# Patient Record
Sex: Female | Born: 1995 | Hispanic: No | Marital: Married | Smoking: Never smoker
Health system: Southern US, Community
[De-identification: ages and names within clinical notes are randomized; demographics above are authoritative.]

## PROBLEM LIST (undated history)

## (undated) DIAGNOSIS — D219 Benign neoplasm of connective and other soft tissue, unspecified: Secondary | ICD-10-CM

## (undated) HISTORY — DX: Benign neoplasm of connective and other soft tissue, unspecified: D21.9

## (undated) HISTORY — PX: NO PAST SURGERIES: SHX2092

---

## 2019-06-27 ENCOUNTER — Encounter (HOSPITAL_COMMUNITY): Payer: Self-pay

## 2019-06-27 ENCOUNTER — Inpatient Hospital Stay (HOSPITAL_COMMUNITY)
Admission: AD | Admit: 2019-06-27 | Discharge: 2019-06-27 | Disposition: A | Discharge status: Home / Self Care | Payer: Medicaid Other | Attending: Obstetrics and Gynecology | Admitting: Obstetrics and Gynecology

## 2019-06-27 ENCOUNTER — Other Ambulatory Visit: Payer: Self-pay

## 2019-06-27 ENCOUNTER — Inpatient Hospital Stay (HOSPITAL_COMMUNITY): Payer: Medicaid Other

## 2019-06-27 DIAGNOSIS — O3680X0 Pregnancy with inconclusive fetal viability, not applicable or unspecified: Secondary | ICD-10-CM | POA: Diagnosis not present

## 2019-06-27 DIAGNOSIS — R109 Unspecified abdominal pain: Secondary | ICD-10-CM

## 2019-06-27 DIAGNOSIS — D259 Leiomyoma of uterus, unspecified: Secondary | ICD-10-CM | POA: Insufficient documentation

## 2019-06-27 DIAGNOSIS — Z3A11 11 weeks gestation of pregnancy: Secondary | ICD-10-CM | POA: Insufficient documentation

## 2019-06-27 DIAGNOSIS — O9989 Other specified diseases and conditions complicating pregnancy, childbirth and the puerperium: Secondary | ICD-10-CM | POA: Diagnosis not present

## 2019-06-27 DIAGNOSIS — O3411 Maternal care for benign tumor of corpus uteri, first trimester: Secondary | ICD-10-CM | POA: Diagnosis not present

## 2019-06-27 DIAGNOSIS — R102 Pelvic and perineal pain: Secondary | ICD-10-CM | POA: Diagnosis not present

## 2019-06-27 DIAGNOSIS — Z603 Acculturation difficulty: Secondary | ICD-10-CM

## 2019-06-27 DIAGNOSIS — O26899 Other specified pregnancy related conditions, unspecified trimester: Secondary | ICD-10-CM | POA: Diagnosis present

## 2019-06-27 LAB — ABO/RH: ABO/RH(D): O POS

## 2019-06-27 LAB — WET PREP, GENITAL
Sperm: NONE SEEN
Trich, Wet Prep: NONE SEEN
Yeast Wet Prep HPF POC: NONE SEEN

## 2019-06-27 LAB — URINALYSIS, ROUTINE W REFLEX MICROSCOPIC
Bacteria, UA: NONE SEEN
Bilirubin Urine: NEGATIVE
Glucose, UA: NEGATIVE mg/dL
Ketones, ur: NEGATIVE mg/dL
Leukocytes,Ua: NEGATIVE
Nitrite: NEGATIVE
Protein, ur: NEGATIVE mg/dL
Specific Gravity, Urine: 1.006 (ref 1.005–1.030)
pH: 7 (ref 5.0–8.0)

## 2019-06-27 LAB — CBC WITH DIFFERENTIAL/PLATELET
Abs Immature Granulocytes: 0.03 10*3/uL (ref 0.00–0.07)
Basophils Absolute: 0 10*3/uL (ref 0.0–0.1)
Basophils Relative: 0 %
Eosinophils Absolute: 0.1 10*3/uL (ref 0.0–0.5)
Eosinophils Relative: 2 %
HCT: 38.7 % (ref 36.0–46.0)
Hemoglobin: 12.9 g/dL (ref 12.0–15.0)
Immature Granulocytes: 0 %
Lymphocytes Relative: 28 %
Lymphs Abs: 2.5 10*3/uL (ref 0.7–4.0)
MCH: 25.5 pg — ABNORMAL LOW (ref 26.0–34.0)
MCHC: 33.3 g/dL (ref 30.0–36.0)
MCV: 76.6 fL — ABNORMAL LOW (ref 80.0–100.0)
Monocytes Absolute: 0.8 10*3/uL (ref 0.1–1.0)
Monocytes Relative: 9 %
Neutro Abs: 5.4 10*3/uL (ref 1.7–7.7)
Neutrophils Relative %: 61 %
Platelets: 130 10*3/uL — ABNORMAL LOW (ref 150–400)
RBC: 5.05 MIL/uL (ref 3.87–5.11)
RDW: 14 % (ref 11.5–15.5)
WBC: 8.9 10*3/uL (ref 4.0–10.5)
nRBC: 0 % (ref 0.0–0.2)

## 2019-06-27 LAB — HCG, QUANTITATIVE, PREGNANCY: hCG, Beta Chain, Quant, S: 108655 m[IU]/mL — ABNORMAL HIGH (ref <5)

## 2019-06-27 LAB — HIV ANTIBODY (ROUTINE TESTING W REFLEX): HIV Screen 4th Generation wRfx: NONREACTIVE

## 2019-06-27 MED ORDER — CALCIUM CARBONATE ANTACID 500 MG PO CHEW: 1.0000 | CHEWABLE_TABLET | Freq: Three times a day (TID) | ORAL | 1 refills | Status: DC | PRN

## 2019-06-27 MED ORDER — ALUM & MAG HYDROXIDE-SIMETH 200-200-20 MG/5ML PO SUSP
30.0000 mL | Freq: Once | ORAL | Status: AC
  Administered 2019-06-27: 30 mL via ORAL
  Filled 2019-06-27: qty 30

## 2019-06-27 MED ORDER — HYOSCYAMINE SULFATE 0.125 MG SL SUBL
0.2500 mg | SUBLINGUAL_TABLET | Freq: Once | SUBLINGUAL | Status: AC
  Administered 2019-06-27: 0.25 mg via SUBLINGUAL
  Filled 2019-06-27: qty 2

## 2019-06-27 NOTE — MAU Provider Note (Signed)
Chief Complaint: Abdominal Pain   First Provider Initiated Contact with Patient 06/27/19 0214        SUBJECTIVE HPI: Betty Crawford is a 23 y.o. G1P0 at Unknown by LMP who presents to maternity admissions reporting upper abdominal discomfort and burping which woke her up.  Also has some lower abdominal cramping.  Did not take anything for it. Has not had any prenatal care yet except a pregnancy test at Urgent Care.. She denies vaginal bleeding, vaginal itching/burning, urinary symptoms, h/a, dizziness, n/v, or fever/chills.    New to [place] from [place].  Cannot get interpretor on Ipad.  Husband translates (he has been in [place] 7 yrs)  RN note: Pt reporting increase in gas/burping x3-4 weeks and reports unable to sleep. Abdominal pain at night   Level 5 Caveat due to language barrier and no translator except husband  Social History   Socioeconomic History  . Marital status: Married    Spouse name: Not on file  . Number of children: Not on file  . Years of education: Not on file  . Highest education level: Not on file  Occupational History  . Not on file  Social Needs  . Financial resource strain: Not on file  . Food insecurity    Worry: Not on file    Inability: Not on file  . Transportation needs    Medical: Not on file    Non-medical: Not on file  Tobacco Use  . Smoking status: Not on file  Substance and Sexual Activity  . Alcohol use: Not on file  . Drug use: Not on file  . Sexual activity: Not on file  Lifestyle  . Physical activity    Days per week: Not on file    Minutes per session: Not on file  . Stress: Not on file  Relationships  . Social Herbalist on phone: Not on file    Gets together: Not on file    Attends religious service: Not on file    Active member of club or organization: Not on file    Attends meetings of clubs or organizations: Not on file    Relationship status: Not on file  . Intimate partner violence    Fear of current or ex partner:  Not on file    Emotionally abused: Not on file    Physically abused: Not on file    Forced sexual activity: Not on file  Other Topics Concern  . Not on file  Social History Narrative  . Not on file   No current facility-administered medications on file prior to encounter.    No current outpatient medications on file prior to encounter.   No Known Allergies  I have reviewed patient's Past Medical Hx, Surgical Hx, Family Hx, Social Hx, medications and allergies.   ROS:  Review of Systems  Constitutional: Negative for chills, fatigue and fever.  HENT: Negative for sore throat.   Respiratory: Negative for shortness of breath.   Gastrointestinal: Positive for abdominal pain. Negative for constipation, diarrhea, nausea and vomiting.  Genitourinary: Positive for pelvic pain. Negative for dysuria and vaginal bleeding.   Review of Systems  Other systems negative   Physical Exam  Physical Exam Vitals:   06/27/19 0221 06/27/19 0459  BP: (!) 129/56 125/64  Pulse: 64 (!) 56  Resp: 16 17  Temp: (!) 97.4 F (36.3 C) 98.9 F (37.2 C)  TempSrc: Oral Oral    Constitutional: Well-developed, well-nourished female in no acute distress.  Cardiovascular: normal rate Respiratory: normal effort GI: Abd soft, diffusely tender throughout. Pos BS x 4 MS: Extremities nontender, no edema, normal ROM Neurologic: Alert and oriented x 4.  GU: Neg CVAT.  PELVIC EXAM: Cervix pink, visually closed, without lesion, scant white creamy discharge, vaginal walls and external genitalia normal Bimanual exam: Cervix 0/long/high, firm, anterior, neg CMT, uterus mildly tender, 12 wk size enlarged, adnexa without tenderness, enlargement, or mass    LAB RESULTS Results for orders placed or performed during the hospital encounter of 06/27/19 (from the past 24 hour(s))  Urinalysis, Routine w reflex microscopic     Status: Abnormal   Collection Time: 06/27/19  1:58 AM  Result Value Ref Range   Color, Urine  STRAW (A) YELLOW   APPearance CLEAR CLEAR   Specific Gravity, Urine 1.006 1.005 - 1.030   pH 7.0 5.0 - 8.0   Glucose, UA NEGATIVE NEGATIVE mg/dL   Hgb urine dipstick SMALL (A) NEGATIVE   Bilirubin Urine NEGATIVE NEGATIVE   Ketones, ur NEGATIVE NEGATIVE mg/dL   Protein, ur NEGATIVE NEGATIVE mg/dL   Nitrite NEGATIVE NEGATIVE   Leukocytes,Ua NEGATIVE NEGATIVE   RBC / HPF 0-5 0 - 5 RBC/hpf   WBC, UA 0-5 0 - 5 WBC/hpf   Bacteria, UA NONE SEEN NONE SEEN   Squamous Epithelial / LPF 0-5 0 - 5  ABO/Rh     Status: None   Collection Time: 06/27/19  2:47 AM  Result Value Ref Range   ABO/RH(D)      O POS Performed at [facility], [address], [place], [place] [address]   hCG, quantitative, pregnancy     Status: Abnormal   Collection Time: 06/27/19  2:47 AM  Result Value Ref Range   hCG, Beta [employer], S 108,655 (H) <5 mIU/mL  CBC with Differential/Platelet     Status: Abnormal   Collection Time: 06/27/19  2:47 AM  Result Value Ref Range   WBC 8.9 4.0 - 10.5 K/uL   RBC 5.05 3.87 - 5.11 MIL/uL   Hemoglobin 12.9 12.0 - 15.0 g/dL   HCT 38.7 36.0 - 46.0 %   MCV 76.6 (L) 80.0 - 100.0 fL   MCH 25.5 (L) 26.0 - 34.0 pg   MCHC 33.3 30.0 - 36.0 g/dL   RDW 14.0 11.5 - 15.5 %   Platelets 130 (L) 150 - 400 K/uL   nRBC 0.0 0.0 - 0.2 %   Neutrophils Relative % 61 %   Neutro Abs 5.4 1.7 - 7.7 K/uL   Lymphocytes Relative 28 %   Lymphs Abs 2.5 0.7 - 4.0 K/uL   Monocytes Relative 9 %   Monocytes Absolute 0.8 0.1 - 1.0 K/uL   Eosinophils Relative 2 %   Eosinophils Absolute 0.1 0.0 - 0.5 K/uL   Basophils Relative 0 %   Basophils Absolute 0.0 0.0 - 0.1 K/uL   Immature Granulocytes 0 %   Abs Immature Granulocytes 0.03 0.00 - 0.07 K/uL  Wet prep, genital     Status: Abnormal   Collection Time: 06/27/19  2:50 AM   Specimen: Cervical/Vaginal swab  Result Value Ref Range   Yeast Wet Prep HPF POC NONE SEEN NONE SEEN   Trich, Wet Prep NONE SEEN NONE SEEN   Clue Cells Wet Prep HPF POC  PRESENT (A) NONE SEEN   WBC, Wet Prep HPF POC FEW (A) NONE SEEN   Sperm NONE SEEN      IMAGING US Ob Comp Less 14 Wks  Result Date:  06/27/2019 CLINICAL DATA:  Pelvic pain EXAM: OBSTETRIC <14 WK ULTRASOUND TECHNIQUE: Transabdominal ultrasound was performed for evaluation of the gestation as well as the maternal uterus and adnexal regions. COMPARISON:  None. FINDINGS: LMP: 04/07/2019 Gestational age by LMP: 11 weeks 4 days EDC by LMP: 01/12/2020 Intrauterine gestational sac: Single Yolk sac:  Visualized. Embryo:  Visualized. Cardiac Activity: Visualized. Heart Rate: 166 bpm CRL:   46.7 mm   11 w 3 d                  [place] EDC: 01/13/2020 Subchorionic hemorrhage: Small amount of subchorionic hemorrhage seen posteriorly Maternal uterus/adnexae: Normal appearance of the ovaries with a left corpus luteum cyst. A 5.0 x 3.7 x 3.5 cm hypoechoic fibroid is seen in the lower uterine segment. IMPRESSION: Single viable intrauterine gestation. Estimated gestational age of [redacted] weeks 3 days by crown-rump length. Left corpus luteum cyst. 5 cm uterine fibroid in the lower uterine segment. Electronically Signed   By: Lovena Le M.D.   On: 06/27/2019 03:36   MAU Management/MDM: Ordered usual first trimester r/o ectopic labs.   Pelvic exam and cultures done Will check baseline Ultrasound to rule out ectopic.  This bleeding/pain can represent a normal pregnancy with bleeding, spontaneous abortion or even an ectopic which can be life-threatening.  The process as listed above helps to determine which of these is present.  Ordered Levsin and Maalox for abdominal discomfort.  States it helped some but is still feeling crampy throughout DIscussed that early pregnancy can cause nausea and constipation.  This may be related to her discomfort, though it is not clear what the source is.  Labs revewed and are WNL.  No laukocytosis.  [place] rules out ectopic pregnancy.  ASSESSMENT Single intrauterine pregnancy at [redacted]w[redacted]d [place] confirms  LMP dates Colicky abdominal cramping  PLAN Discharge home Rx Tums for prn use at home Has prenatal vitamins at home List of OB providers given Proof of pregnancy letter given to apply for Medicaid Pt stable at time of discharge. Encouraged to return here or to other Urgent Care/ED if she develops worsening of symptoms, increase in pain, fever, or other concerning symptoms.    Hansel Feinstein CNM, MSN Certified Nurse-Midwife 06/27/2019  2:15 AM

## 2019-06-27 NOTE — MAU Note (Signed)
Pt reporting increase in gas/burping x3-4 weeks and reports unable to sleep. Abdominal pain at night.

## 2019-06-27 NOTE — Discharge Instructions (Signed)
Heartburn During Pregnancy  Heartburn is pain or discomfort in the throat or chest. It may cause a burning feeling. It happens when stomach acid moves up into the tube that carries food from your mouth to your stomach (esophagus). Heartburn is common during pregnancy. It usually goes away or gets better after giving birth. Follow these instructions at home: Eating and drinking  Do not drink alcohol while you are pregnant.  Figure out which foods and beverages make you feel worse, and avoid them.  Beverages that you may want to avoid include: ? Coffee and tea (with or without caffeine). ? Energy drinks and sports drinks. ? Bubbly (carbonated) drinks or sodas. ? Citrus fruit juices.  Foods that you may want to avoid include: ? Chocolate and cocoa. ? Peppermint and mint flavorings. ? Garlic, onions, and horseradish. ? Spicy and acidic foods. These include peppers, chili powder, curry powder, vinegar, hot sauces, and barbecue sauce. ? Citrus fruits, such as oranges, lemons, and limes. ? Tomato-based foods, such as red sauce, chili, and salsa. ? Fried and fatty foods, such as donuts, french fries, potato chips, and high-fat dressings. ? High-fat meats, such as hot dogs, cold cuts, sausage, ham, and bacon. ? High-fat dairy items, such as whole milk, butter, and cheese.  Eat small meals often, instead of large meals.  Avoid drinking a lot of liquid with your meals.  Avoid eating meals during the 2-3 hours before you go to bed.  Avoid lying down right after you eat.  Do not exercise right after you eat. Medicines  Take over-the-counter and prescription medicines only as told by your doctor.  Do not take aspirin, ibuprofen, or other NSAIDs unless your doctor tells you to do that.  Your doctor may tell you to avoid medicines that have sodium bicarbonate in them. General instructions   If told, raise the head of your bed about 6 inches (15 cm). You can do this by putting blocks  under the legs. Sleeping with more pillows does not help with heartburn.  Do not use any products that contain nicotine or tobacco, such as cigarettes and e-cigarettes. If you need help quitting, ask your doctor.  Wear loose-fitting clothing.  Try to lower your stress, such as with yoga or meditation. If you need help, ask your doctor.  Stay at a healthy weight. If you are overweight, work with your doctor to safely lose weight.  Keep all follow-up visits as told by your doctor. This is important. Contact a doctor if:  You get new symptoms.  Your symptoms do not get better with treatment.  You have weight loss and you do not know why.  You have trouble swallowing.  You make loud sounds when you breathe (wheeze).  You have a cough that does not go away.  You have heartburn often for more than 2 weeks.  You feel sick to your stomach (nauseous), and this does not get better with treatment.  You are throwing up (vomiting), and this does not get better with treatment.  You have pain in your belly (abdomen). Get help right away if:  You have very bad chest pain that spreads to your arm, neck, or jaw.  You feel sweaty, dizzy, or light-headed.  You have trouble breathing.  You have pain when swallowing.  You throw up and your throw-up looks like blood or coffee grounds.  Your poop (stool) is bloody or black. This information is not intended to replace advice given to you by your health  care provider. Make sure you discuss any questions you have with your health care provider. Document Released: 11/25/2010 Document Revised: 02/13/2019 Document Reviewed: 07/10/2016 Elsevier Patient Education  2020 [place] for [employer] at [facility]       Phone: [phone]  [place] for [employer] at [place]   Phone: [place] for [employer] at [place]  Phone: [place] for  [place] at [employer]  Phone: [place] for [employer] at [place]  Phone: [place] for [place] at [facility]   Phone: [facility]       Phone: [phone]  [facility]    Phone: [phone]   [facility]    Phone: [phone]  [facility]    Phone: [place]    Phone: [phone]  [place] [facility]       Phone: [phone]   [place] [facility]  Phone: [place]    Phone: [phone]  [facility]   Phone: [phone] Second Trimester of Pregnancy  The second trimester is from week 14 through week 27 (month 4 through 6). This is often the time in pregnancy that you feel your best. Often times, morning sickness has lessened or quit. You may have more energy, and you may get hungry more often. Your unborn baby is growing rapidly. At the end of the sixth month, he or she is about 9 inches long and weighs about 1 pounds. You will likely feel the baby move between 18 and 20 weeks of pregnancy. Follow these instructions at home: Medicines  Take over-the-counter and prescription medicines only as told by your doctor. Some medicines are safe and some medicines are not safe during pregnancy.  Take a prenatal vitamin that contains at least 600 micrograms (mcg) of folic acid.  If you have trouble pooping (constipation), take medicine that will make your stool soft (stool softener) if your doctor approves. Eating and drinking   Eat regular, healthy meals.  Avoid raw meat and uncooked cheese.  If you get low calcium from the food you eat, talk to your doctor about taking a daily calcium supplement.  Avoid foods that are high in fat and sugars, such as fried and sweet foods.  If  you feel sick to your stomach (nauseous) or throw up (vomit): ? Eat 4 or 5 small meals a day instead of 3 large meals. ? Try eating a few soda crackers. ? Drink liquids between meals instead of during meals.  To prevent constipation: ? Eat foods that are high in fiber, like fresh fruits and vegetables, whole grains, and beans. ? Drink enough fluids to keep your pee (urine) clear or pale yellow. Activity  Exercise only as told by your doctor. Stop exercising if you start to have cramps.  Do not exercise if it is too hot, too humid, or if you are in a place of great height (high altitude).  Avoid heavy lifting.  Wear low-heeled shoes. Sit and stand up straight.  You can continue to have sex unless your doctor tells you not to. Relieving pain and discomfort  Wear a good support bra if your breasts are tender.  Take warm water baths (sitz baths) to soothe pain or discomfort caused by hemorrhoids. Use hemorrhoid cream if your doctor approves.  Rest with your legs raised if you have leg cramps or low back pain.  If you develop puffy, bulging veins (varicose veins) in your legs: ? Wear support hose or compression stockings as told by your doctor. ? Raise (elevate) your feet for 15 minutes, 3-4 times a day. ? Limit salt in your food. Prenatal care  Write down your questions. Take them to your prenatal visits.  Keep all your prenatal visits as told by your doctor. This is important. Safety  Wear your seat belt when driving.  Make a list of emergency phone numbers, including numbers for family, friends, the hospital, and police and fire departments. General instructions  Ask your doctor about the right foods to eat or for help finding a counselor, if you need these services.  Ask your doctor about local prenatal classes. Begin classes before month 6 of your pregnancy.  Do not use hot tubs, steam rooms, or saunas.  Do not douche or use tampons or scented sanitary pads.  Do not  cross your legs for long periods of time.  Visit your dentist if you have not done so. Use a soft toothbrush to brush your teeth. Floss gently.  Avoid all smoking, herbs, and alcohol. Avoid drugs that are not approved by your doctor.  Do not use any products that contain nicotine or tobacco, such as cigarettes and e-cigarettes. If you need help quitting, ask your doctor.  Avoid cat litter boxes and soil used by cats. These carry germs that can cause birth defects in the baby and can cause a loss of your baby (miscarriage) or stillbirth. Contact a doctor if:  You have mild cramps or pressure in your lower belly.  You have pain when you pee (urinate).  You have bad smelling fluid coming from your vagina.  You continue to feel sick to your stomach (nauseous), throw up (vomit), or have watery poop (diarrhea).  You have a nagging pain in your belly area.  You feel dizzy. Get help right away if:  You have a fever.  You are leaking fluid from your vagina.  You have spotting or bleeding from your vagina.  You have severe belly cramping or pain.  You lose or gain weight rapidly.  You have trouble catching your breath and have chest pain.  You notice sudden or extreme puffiness (swelling) of your face, hands, ankles, feet, or legs.  You have not felt the baby move in over an hour.  You have severe headaches that do not go away when you take medicine.  You have trouble seeing. Summary  The second trimester is from week 14 through week 27 (months 4 through 6). This is often the time in pregnancy that you feel your best.  To take care of yourself and your unborn baby, you will need to eat healthy meals, take medicines only if your doctor tells you to do so, and do activities that are safe for you and your baby.  Call your doctor if you get sick or if you notice anything unusual about your pregnancy. Also, call your doctor if you need help with the right food to eat, or if you want  to know what activities are safe for you. This information is not intended to replace advice given to you by your health care provider. Make sure you discuss any questions you have with your health care provider. Document Released: 01/17/2010 Document Revised: 02/14/2019 Document Reviewed: 11/28/2016 Elsevier Patient Education  2020 [facility]    Phone: [phone]

## 2019-06-28 LAB — GC/CHLAMYDIA PROBE AMP (~~LOC~~) NOT AT ARMC
Chlamydia: NEGATIVE
Neisseria Gonorrhea: NEGATIVE

## 2019-07-21 ENCOUNTER — Encounter: Payer: Self-pay | Admitting: Obstetrics and Gynecology

## 2019-07-21 ENCOUNTER — Ambulatory Visit (INDEPENDENT_AMBULATORY_CARE_PROVIDER_SITE_OTHER): Payer: Medicaid Other | Admitting: Obstetrics and Gynecology

## 2019-07-21 ENCOUNTER — Other Ambulatory Visit: Payer: Self-pay

## 2019-07-21 VITALS — BP 135/84 | HR 88 | Wt 150.0 lb

## 2019-07-21 DIAGNOSIS — Z124 Encounter for screening for malignant neoplasm of cervix: Secondary | ICD-10-CM | POA: Diagnosis not present

## 2019-07-21 DIAGNOSIS — Z113 Encounter for screening for infections with a predominantly sexual mode of transmission: Secondary | ICD-10-CM | POA: Diagnosis not present

## 2019-07-21 DIAGNOSIS — Z789 Other specified health status: Secondary | ICD-10-CM

## 2019-07-21 DIAGNOSIS — R12 Heartburn: Secondary | ICD-10-CM

## 2019-07-21 DIAGNOSIS — Z758 Other problems related to medical facilities and other health care: Secondary | ICD-10-CM

## 2019-07-21 DIAGNOSIS — R109 Unspecified abdominal pain: Secondary | ICD-10-CM

## 2019-07-21 DIAGNOSIS — Z3400 Encounter for supervision of normal first pregnancy, unspecified trimester: Secondary | ICD-10-CM

## 2019-07-21 DIAGNOSIS — Z603 Acculturation difficulty: Secondary | ICD-10-CM

## 2019-07-21 DIAGNOSIS — O26899 Other specified pregnancy related conditions, unspecified trimester: Secondary | ICD-10-CM

## 2019-07-21 DIAGNOSIS — Z23 Encounter for immunization: Secondary | ICD-10-CM

## 2019-07-21 DIAGNOSIS — O26892 Other specified pregnancy related conditions, second trimester: Secondary | ICD-10-CM

## 2019-07-21 HISTORY — DX: Encounter for supervision of normal first pregnancy, unspecified trimester: Z34.00

## 2019-07-21 NOTE — Progress Notes (Signed)
INITIAL PRENATAL VISIT NOTE  Subjective:  Betty Crawford is a 23 y.o. G1P0 at [redacted]w[redacted]d by LMP being seen today for her initial prenatal visit. This is a planned pregnancy. She and partner are happy with the pregnancy. She has a medical history significant for n/a.  Patient reports some vaginal pressure earlier in pregnancy, now improved. .  Contractions: Not present. Vag. Bleeding: None.   . Denies leaking of fluid.    History reviewed. No pertinent past medical history.  History reviewed. No pertinent surgical history.  OB History  Gravida Para Term Preterm AB Living  1            SAB TAB Ectopic Multiple Live Births               # Outcome Date GA Lbr Len/2nd Weight Sex Delivery Anes PTL Lv  1 Current             Social History   Socioeconomic History  . Marital status: Married    Spouse name: Not on file  . Number of children: Not on file  . Years of education: Not on file  . Highest education level: Not on file  Occupational History  . Not on file  Social Needs  . Financial resource strain: Not on file  . Food insecurity    Worry: Not on file    Inability: Not on file  . Transportation needs    Medical: Not on file    Non-medical: Not on file  Tobacco Use  . Smoking status: Never Smoker  . Smokeless tobacco: Never Used  Substance and Sexual Activity  . Alcohol use: Never    Frequency: Never  . Drug use: Never  . Sexual activity: Yes  Lifestyle  . Physical activity    Days per week: Not on file    Minutes per session: Not on file  . Stress: Not on file  Relationships  . Social Herbalist on phone: Not on file    Gets together: Not on file    Attends religious service: Not on file    Active member of club or organization: Not on file    Attends meetings of clubs or organizations: Not on file    Relationship status: Not on file  Other Topics Concern  . Not on file  Social History Narrative  . Not on file    History reviewed. No pertinent  family history.   Current Outpatient Medications:  .  calcium carbonate (TUMS) 500 MG chewable tablet, Chew 1 tablet (200 mg of elemental calcium total) by mouth every 8 (eight) hours as needed for indigestion or heartburn., Disp: 30 tablet, Rfl: 1 .  Prenatal Vit-Fe Fumarate-FA (MULTIVITAMIN-PRENATAL) 27-0.8 MG TABS tablet, Take 1 tablet by mouth daily at 12 noon., Disp: , Rfl:   No Known Allergies  Review of Systems: Negative except for what is mentioned in HPI.  Objective:   Vitals:   07/21/19 1343  BP: 135/84  Pulse: 88  Weight: 150 lb (68 kg)   Fetal Status: Fetal Heart Rate (bpm): 143         Physical Exam: BP 135/84   Pulse 88   Wt 150 lb (68 kg)   LMP 04/07/2019  CONSTITUTIONAL: Well-developed, well-nourished female in no acute distress.  NEUROLOGIC: Alert and oriented to person, place, and time. Normal reflexes, muscle tone coordination. No cranial nerve deficit noted. PSYCHIATRIC: Normal mood and affect. Normal behavior. Normal judgment and thought content. SKIN:  Skin is warm and dry. No rash noted. Not diaphoretic. No erythema. No pallor. HENT:  Normocephalic, atraumatic, External right and left ear normal. Oropharynx is clear and moist EYES: Conjunctivae and EOM are normal. Pupils are equal, round, and reactive to light. No scleral icterus.  NECK: Normal range of motion, supple, no masses CARDIOVASCULAR: Normal heart rate noted RESPIRATORY: Effort normal, no problems with respiration noted BREASTS: symmetric, non-tender, no masses palpable ABDOMEN: Soft, nontender, nondistended, gravid. GU: normal appearing external female genitalia, nulliparous normal appearing cervix, scant white discharge in vagina, no lesions noted, very friable and some light spotting with pap Bimanual: 15 weeks sized uterus, no adnexal tenderness or palpable lesions noted MUSCULOSKELETAL: Normal range of motion. EXT:  No edema and no tenderness. 2+ distal pulses.  Assessment and Plan:   Pregnancy: G1P0 at [redacted]w[redacted]d by LMP c/w 1st trim [place]  1. Abdominal pain affecting pregnancy Vaginal pressure earlier in pregnancy  2. Supervision of normal first pregnancy, antepartum - Culture, OB Urine - Obstetric Panel, Including HIV - Genetic Screening - Cytology - PAP( [place]) - Cervicovaginal ancillary only( [place]) - Babyscripts Schedule Optimization [place] for [employer] structure, multiple providers, fellows, medical students, virtual visits, MyChart.   3. Language barrier Arabic interpretor used  4. Heartburn Taking tums Reviewed can sent prescription if worsens   Preterm labor symptoms and general obstetric precautions including but not limited to vaginal bleeding, contractions, leaking of fluid and fetal movement were reviewed in detail with the patient.  Please refer to After Visit Summary for other counseling recommendations.   Return in about 4 weeks (around 08/18/2019) for low OB, virtual.  Sloan Leiter 07/21/2019 2:28 PM

## 2019-07-21 NOTE — Progress Notes (Signed)
Patient is in the office for NOB visit, reports pelvic pressure and acid reflux. Pt states she has not started feeling fetal movement yet.

## 2019-07-21 NOTE — Patient Instructions (Signed)
For colds and allergies  Any anti-histamine including benadryl, allegra, claritin, etc.  Sudafed but not phenylephrine  Mucinex  Robitussin  For Reflux/heartburn  Pepcid Zantac Tums Prilosec Prevacid  For yeast infections  Monistat  For constipation  Colace  For minor aches and pains  Tylenol-do not take more than 4000mg in 24 hours. Therma-care or like heat packs  

## 2019-07-22 LAB — OBSTETRIC PANEL, INCLUDING HIV
Antibody Screen: NEGATIVE
Basophils Absolute: 0 10*3/uL (ref 0.0–0.2)
Basos: 0 %
EOS (ABSOLUTE): 0.1 10*3/uL (ref 0.0–0.4)
Eos: 1 %
HIV Screen 4th Generation wRfx: NONREACTIVE
Hematocrit: 39.5 % (ref 34.0–46.6)
Hemoglobin: 13.2 g/dL (ref 11.1–15.9)
Hepatitis B Surface Ag: NEGATIVE
Immature Grans (Abs): 0 10*3/uL (ref 0.0–0.1)
Immature Granulocytes: 0 %
Lymphocytes Absolute: 1.8 10*3/uL (ref 0.7–3.1)
Lymphs: 17 %
MCH: 25.4 pg — ABNORMAL LOW (ref 26.6–33.0)
MCHC: 33.4 g/dL (ref 31.5–35.7)
MCV: 76 fL — ABNORMAL LOW (ref 79–97)
Monocytes Absolute: 0.8 10*3/uL (ref 0.1–0.9)
Monocytes: 8 %
Neutrophils Absolute: 7.7 10*3/uL — ABNORMAL HIGH (ref 1.4–7.0)
Neutrophils: 74 %
Platelets: 185 10*3/uL (ref 150–450)
RBC: 5.19 x10E6/uL (ref 3.77–5.28)
RDW: 14.5 % (ref 11.7–15.4)
RPR Ser Ql: NONREACTIVE
Rh Factor: POSITIVE
Rubella Antibodies, IGG: 29 index (ref >0.99)
WBC: 10.5 10*3/uL (ref 3.4–10.8)

## 2019-07-22 MED ORDER — BLOOD PRESSURE KIT DEVI: 1.0000 | 0 refills | Status: DC | PRN

## 2019-07-22 NOTE — Addendum Note (Signed)
Addended by: Tristan Schroeder D on: 07/22/2019 08:46 AM   Modules accepted: Orders

## 2019-07-23 LAB — CYTOLOGY - PAP
Chlamydia: NEGATIVE
Diagnosis: NEGATIVE
Neisseria Gonorrhea: NEGATIVE

## 2019-07-23 LAB — URINE CULTURE, OB REFLEX

## 2019-07-23 LAB — CULTURE, OB URINE

## 2019-07-30 ENCOUNTER — Encounter: Payer: Self-pay | Admitting: Obstetrics and Gynecology

## 2019-08-01 ENCOUNTER — Encounter: Payer: Self-pay | Admitting: Obstetrics and Gynecology

## 2019-08-18 ENCOUNTER — Ambulatory Visit (INDEPENDENT_AMBULATORY_CARE_PROVIDER_SITE_OTHER): Payer: Medicaid Other | Admitting: Obstetrics

## 2019-08-18 ENCOUNTER — Encounter: Payer: Self-pay | Admitting: Obstetrics

## 2019-08-18 ENCOUNTER — Other Ambulatory Visit: Payer: Self-pay

## 2019-08-18 DIAGNOSIS — Z3A19 19 weeks gestation of pregnancy: Secondary | ICD-10-CM

## 2019-08-18 DIAGNOSIS — Z3402 Encounter for supervision of normal first pregnancy, second trimester: Secondary | ICD-10-CM

## 2019-08-18 DIAGNOSIS — Z3400 Encounter for supervision of normal first pregnancy, unspecified trimester: Secondary | ICD-10-CM

## 2019-08-18 NOTE — Progress Notes (Signed)
S/w pt and husband for webex visit. Pt reports fetal movement, denies pain. Pt and husband state that they have not been able to get BP cuff to work, advised them to bring it into the office.

## 2019-08-18 NOTE — Progress Notes (Signed)
   [place] VIRTUAL VIDEO VISIT ENCOUNTER NOTE  Provider location: [facility] at [place]   I connected with Lourena Simmonds on 08/18/19 at  9:30 AM EDT by WebEx OB Video Encounter at home and verified that I am speaking with the correct person using two identifiers.   I discussed the limitations, risks, security and privacy concerns of performing an evaluation and management service virtually and the availability of in person appointments. I also discussed with the patient that there may be a patient responsible charge related to this service. The patient expressed understanding and agreed to proceed. Subjective:  Betty Crawford is a 23 y.o. G1P0 at [redacted]w[redacted]d being seen today for ongoing prenatal care.  She is currently monitored for the following issues for this low-risk pregnancy and has Abdominal pain affecting pregnancy; Language barrier, cultural differences; and Supervision of normal first pregnancy, antepartum on their problem list.  Patient reports no complaints.  Contractions: Not present. Vag. Bleeding: None.  Movement: Present. Denies any leaking of fluid.   The following portions of the patient's history were reviewed and updated as appropriate: allergies, current medications, past family history, past medical history, past social history, past surgical history and problem list.   Objective:  There were no vitals filed for this visit.  Fetal Status:     Movement: Present     General:  Alert, oriented and cooperative. Patient is in no acute distress.  Respiratory: Normal respiratory effort, no problems with respiration noted  Mental Status: Normal mood and affect. Normal behavior. Normal judgment and thought content.  Rest of physical exam deferred due to type of encounter  Imaging: No results found.  Assessment and Plan:  Pregnancy: G1P0 at [redacted]w[redacted]d 1. Supervision of normal first pregnancy, antepartum   Preterm labor symptoms and general  obstetric precautions including but not limited to vaginal bleeding, contractions, leaking of fluid and fetal movement were reviewed in detail with the patient. I discussed the assessment and treatment plan with the patient. The patient was provided an opportunity to ask questions and all were answered. The patient agreed with the plan and demonstrated an understanding of the instructions. The patient was advised to call back or seek an in-person office evaluation/go to MAU at [facility] for any urgent or concerning symptoms. Please refer to After Visit Summary for other counseling recommendations.   I provided 10 minutes of face-to-face time during this encounter.  No follow-ups on file.  Future Appointments  Date Time Provider [place]  08/20/2019 10:30 AM WH-MFC [place] 1 WH-MFCUS MFC-US     , MD [facility] 08/18/2019

## 2019-08-20 ENCOUNTER — Ambulatory Visit (HOSPITAL_COMMUNITY)
Admission: RE | Admit: 2019-08-20 | Discharge: 2019-08-20 | Disposition: A | Discharge status: Home / Self Care | Payer: Medicaid Other | Source: Ambulatory Visit | Attending: Obstetrics and Gynecology | Admitting: Obstetrics and Gynecology

## 2019-08-20 ENCOUNTER — Other Ambulatory Visit: Payer: Self-pay | Admitting: Obstetrics and Gynecology

## 2019-08-20 ENCOUNTER — Other Ambulatory Visit (HOSPITAL_COMMUNITY): Payer: Self-pay | Admitting: *Deleted

## 2019-08-20 ENCOUNTER — Other Ambulatory Visit: Payer: Self-pay

## 2019-08-20 DIAGNOSIS — Z3A19 19 weeks gestation of pregnancy: Secondary | ICD-10-CM

## 2019-08-20 DIAGNOSIS — D259 Leiomyoma of uterus, unspecified: Secondary | ICD-10-CM

## 2019-08-20 DIAGNOSIS — O3412 Maternal care for benign tumor of corpus uteri, second trimester: Secondary | ICD-10-CM

## 2019-08-20 DIAGNOSIS — Z3400 Encounter for supervision of normal first pregnancy, unspecified trimester: Secondary | ICD-10-CM | POA: Diagnosis present

## 2019-08-20 DIAGNOSIS — Z363 Encounter for antenatal screening for malformations: Secondary | ICD-10-CM

## 2019-08-20 DIAGNOSIS — Z362 Encounter for other antenatal screening follow-up: Secondary | ICD-10-CM

## 2019-08-20 DIAGNOSIS — Z3686 Encounter for antenatal screening for cervical length: Secondary | ICD-10-CM | POA: Diagnosis not present

## 2019-09-15 ENCOUNTER — Encounter: Payer: Self-pay | Admitting: Obstetrics and Gynecology

## 2019-09-15 ENCOUNTER — Ambulatory Visit (INDEPENDENT_AMBULATORY_CARE_PROVIDER_SITE_OTHER): Payer: Medicaid Other | Admitting: Obstetrics and Gynecology

## 2019-09-15 ENCOUNTER — Other Ambulatory Visit: Payer: Self-pay

## 2019-09-15 DIAGNOSIS — Z3402 Encounter for supervision of normal first pregnancy, second trimester: Secondary | ICD-10-CM

## 2019-09-15 DIAGNOSIS — Z603 Acculturation difficulty: Secondary | ICD-10-CM

## 2019-09-15 DIAGNOSIS — Z3400 Encounter for supervision of normal first pregnancy, unspecified trimester: Secondary | ICD-10-CM

## 2019-09-15 DIAGNOSIS — Z3A23 23 weeks gestation of pregnancy: Secondary | ICD-10-CM

## 2019-09-15 NOTE — Progress Notes (Signed)
[place] VIRTUAL VIDEO VISIT ENCOUNTER NOTE  Provider location: [facility] at [place]   I connected with Betty Crawford on 09/15/19 at  8:30 AM EST by WebEx Video Encounter at home and verified that I am speaking with the correct person using two identifiers.   I discussed the limitations, risks, security and privacy concerns of performing an evaluation and management service virtually and the availability of in person appointments. I also discussed with the patient that there may be a patient responsible charge related to this service. The patient expressed understanding and agreed to proceed. Subjective:  Betty Crawford is a 23 y.o. G1P0 at [redacted]w[redacted]d being seen today for ongoing prenatal care.  She is currently monitored for the following issues for this low-risk pregnancy and has Abdominal pain affecting pregnancy; Language barrier, cultural differences; and Supervision of normal first pregnancy, antepartum on their problem list.  Patient reports no complaints.  Contractions: Not present. Vag. Bleeding: None.  Movement: Present. Denies any leaking of fluid.   The following portions of the patient's history were reviewed and updated as appropriate: allergies, current medications, past family history, past medical history, past social history, past surgical history and problem list.   Objective:  There were no vitals filed for this visit.  Fetal Status:     Movement: Present     General:  Alert, oriented and cooperative. Patient is in no acute distress.  Respiratory: Normal respiratory effort, no problems with respiration noted  Mental Status: Normal mood and affect. Normal behavior. Normal judgment and thought content.  Rest of physical exam deferred due to type of encounter  Imaging: [place] Mfm Ob Transvaginal  Result Date: 08/20/2019 ----------------------------------------------------------------------  OBSTETRICS REPORT                        (Signed Final 08/20/2019 01:14 pm) ---------------------------------------------------------------------- Patient Info  ID #:       [id number]                          D.O.B.:  May 26, 1996 (23 yrs)  Name:       Betty Crawford                  Visit Date: 08/20/2019 10:25 am ---------------------------------------------------------------------- Performed By  Performed By:     Valda Favia          Ref. Address:     [address]  [place]                                                             [place]  Attending:        Johnell Comings MD         [facility]  Referred By:      [facility] ---------------------------------------------------------------------- Orders   #  Description                          Code         Ordered By   1  [place] MFM OB COMP + 14 WK               76805.01     [place]   2  [place] MFM OB TRANSVAGINAL               [id number]      KELLY DAVIS  ----------------------------------------------------------------------   #  Order #                    Accession #                 Episode #   1  [id number]                  [id number]                  [id number]   2  [id number]                  [id number]                  [id number]  ---------------------------------------------------------------------- Indications   Uterine fibroids affecting pregnancy in        O34.12, D25.9   second trimester, antepartum   Encounter for cervical length                  Z36.86   Encounter for antenatal screening for          Z36.3   malformations(low risk NIPS, 4.9FF)   [redacted] weeks gestation of pregnancy                Z3A.19  ---------------------------------------------------------------------- Fetal Evaluation  Num Of  Fetuses:         1  Fetal Heart Rate(bpm):  144  Cardiac Activity:       Observed  Presentation:           Cephalic  Placenta:               Posterior  P. Cord Insertion:      Not well visualized  Amniotic Fluid  AFI FV:      Within normal limits  Largest Pocket(cm)                              3.6 ---------------------------------------------------------------------- Biometry  BPD:      41.7  mm     G. Age:  18w 4d         23  %    CI:        74.88   %    70 - 86                                                          FL/HC:      18.1   %    16.1 - 18.3  HC:      152.9  mm     G. Age:  18w 2d          7  %    HC/AC:      1.12        1.09 - 1.39  AC:       137   mm     G. Age:  19w 1d         40  %    FL/BPD:     66.2   %  FL:       27.6  mm     G. Age:  18w 3d         16  %    FL/AC:      20.1   %    20 - 24  HUM:      27.4  mm     G. Age:  18w 5d         37  %  CER:      19.2  mm     G. Age:  18w 4d         32  %  NFT:       3.4  mm  CM:          5  mm  Est. FW:     256  gm      0 lb 9 oz     19  % ---------------------------------------------------------------------- OB History  Gravidity:    1         Term:   0        Prem:   0        SAB:   0  TOP:          0       Ectopic:  0        Living: 0 ---------------------------------------------------------------------- Gestational Age  LMP:           19w 2d        Date:  04/07/19                 EDD:   01/12/20  U/S Today:     18w 4d                                        EDD:   01/17/20  Best:          Melvyn Neth 2d  Det. By:  LMP  (04/07/19)          EDD:   01/12/20 ---------------------------------------------------------------------- Anatomy  Cranium:               Appears normal         Aortic Arch:            Not well visualized  Cavum:                 Appears normal         Ductal Arch:            Not well visualized  Ventricles:            Appears normal         Diaphragm:              Appears normal  Choroid Plexus:        Appears  normal         Stomach:                Appears normal, left                                                                        sided  Cerebellum:            Appears normal         Abdomen:                Appears normal  Posterior Fossa:       Appears normal         Abdominal Wall:         Appears nml (cord                                                                        insert, abd wall)  Nuchal Fold:           Appears normal         Cord Vessels:           Appears normal (3                                                                        vessel cord)  Face:                  Appears normal         Kidneys:                Appear normal                         (orbits and profile)  Lips:  Appears normal         Bladder:                Appears normal  Thoracic:              Appears normal         Spine:                  Not well visualized  Heart:                 Not well visualized    Upper Extremities:      Visualized  RVOT:                  Not well visualized    Lower Extremities:      Appears normal  LVOT:                  Not well visualized  Other:  Heels visualized. Nasal bone visualized. Feet visualized. Hands not          well visualized. ---------------------------------------------------------------------- Cervix Uterus Adnexa  Cervix  Length:            3.9  cm.  Normal appearance by transabdominal scan.  Uterus  Single fibroid noted, see table below.  Left Ovary  No adnexal mass visualized.  Right Ovary  No adnexal mass visualized.  Cul De Sac  No free fluid seen.  Adnexa  No abnormality visualized. ---------------------------------------------------------------------- Myomas   Site                     L(cm)      W(cm)      D(cm)      Location   Anterior                 5          4          5.2  ----------------------------------------------------------------------   Blood Flow                 RI        PI       Comments   ---------------------------------------------------------------------- Comments  This patient was seen for a detailed fetal anatomy scan.  She  denies any problems in her current pregnancy and denies  any significant past medical history.  She had a cell free DNA test earlier in her pregnancy which  indicated a low risk for trisomy 59, 24, and 13. A female fetus  is predicted.  She was informed that the fetal growth and amniotic fluid  level were appropriate for her gestational age.  Multiple fibroids were noted in the lower uterine segment.  As  the fetal intracranial structures could not be visualized on an  abdominal ultrasound, a transvaginal ultrasound was  performed today.  The fetal cardiac views were unable to be visualized today  due to her fibroid uterus and the fetal position.  The patient was informed that anomalies may be missed due  to technical limitations. If the fetus is in a suboptimal position  or maternal habitus is increased, visualization of the fetus in  the maternal uterus may be impaired.  The increased risk of maternal pain issues and possible fetal  growth issues later in her pregnancy due to the fibroid uterus  was discussed today.  She was advised that we will continue  to follow her closely to assess the fetal growth.  A follow-up  exam was scheduled in 4 weeks to obtain better  cardiac views and to assess the fetal growth. ----------------------------------------------------------------------                   Johnell Comings, MD Electronically Signed Final Report   08/20/2019 01:14 pm ----------------------------------------------------------------------  [place] Mfm Ob Comp + 14 Wk  Result Date: 08/20/2019 ----------------------------------------------------------------------  OBSTETRICS REPORT                       (Signed Final 08/20/2019 01:14 pm) ---------------------------------------------------------------------- Patient Info  ID #:       [id number]                          D.O.B.:   1996-06-18 (23 yrs)  Name:       Betty Crawford                  Visit Date: 08/20/2019 10:25 am ---------------------------------------------------------------------- Performed By  Performed By:     Valda Favia          Ref. Address:     [place]                                                             [place]  Attending:        Johnell Comings MD         [facility]  Referred By:      [place] ---------------------------------------------------------------------- Orders   #  Description  Code         Ordered By   1  [place] MFM OB COMP + 86 WK               [id number]     KELLY DAVIS   2  [place] MFM OB TRANSVAGINAL               [id number]      KELLY DAVIS  ----------------------------------------------------------------------   #  Order #                    Accession #                 Episode #   1  [id number]                  [id number]                  [id number]   2  [id number]                  [id number]                  [id number]  ---------------------------------------------------------------------- Indications   Uterine fibroids affecting pregnancy in        O34.12, D25.9   second trimester, antepartum   Encounter for cervical length                  Z36.86   Encounter for antenatal screening for          Z36.3   malformations(low risk NIPS, 4.9FF)   [redacted] weeks gestation of pregnancy                Z3A.19  ---------------------------------------------------------------------- Fetal Evaluation  Num Of Fetuses:         1  Fetal Heart Rate(bpm):  144  Cardiac Activity:       Observed  Presentation:           Cephalic  Placenta:               Posterior  P. Cord Insertion:       Not well visualized  Amniotic Fluid  AFI FV:      Within normal limits                              Largest Pocket(cm)                              3.6 ---------------------------------------------------------------------- Biometry  BPD:      41.7  mm     G. Age:  18w 4d         23  %    CI:        74.88   %    70 - 86                                                          FL/HC:      18.1   %    16.1 - 18.3  HC:      152.9  mm     G. Age:  18w 2d  7  %    HC/AC:      1.12        1.09 - 1.39  AC:       137   mm     G. Age:  19w 1d         40  %    FL/BPD:     66.2   %  FL:       27.6  mm     G. Age:  18w 3d         16  %    FL/AC:      20.1   %    20 - 24  HUM:      27.4  mm     G. Age:  18w 5d         37  %  CER:      19.2  mm     G. Age:  18w 4d         32  %  NFT:       3.4  mm  CM:          5  mm  Est. FW:     256  gm      0 lb 9 oz     19  % ---------------------------------------------------------------------- OB History  Gravidity:    1         Term:   0        Prem:   0        SAB:   0  TOP:          0       Ectopic:  0        Living: 0 ---------------------------------------------------------------------- Gestational Age  LMP:           19w 2d        Date:  04/07/19                 EDD:   01/12/20  U/S Today:     18w 4d                                        EDD:   01/17/20  Best:          19w 2d     Det. By:  LMP  (04/07/19)          EDD:   01/12/20 ---------------------------------------------------------------------- Anatomy  Cranium:               Appears normal         Aortic Arch:            Not well visualized  Cavum:                 Appears normal         Ductal Arch:            Not well visualized  Ventricles:            Appears normal         Diaphragm:              Appears normal  Choroid Plexus:        Appears normal         Stomach:                Appears  normal, left                                                                        sided  Cerebellum:            Appears normal          Abdomen:                Appears normal  Posterior Fossa:       Appears normal         Abdominal Wall:         Appears nml (cord                                                                        insert, abd wall)  Nuchal Fold:           Appears normal         Cord Vessels:           Appears normal (3                                                                        vessel cord)  Face:                  Appears normal         Kidneys:                Appear normal                         (orbits and profile)  Lips:                  Appears normal         Bladder:                Appears normal  Thoracic:              Appears normal         Spine:                  Not well visualized  Heart:                 Not well visualized    Upper Extremities:      Visualized  RVOT:                  Not well visualized    Lower Extremities:      Appears normal  LVOT:                  Not well visualized  Other:  Heels visualized. Nasal bone visualized. Feet visualized. Hands not  well visualized. ---------------------------------------------------------------------- Cervix Uterus Adnexa  Cervix  Length:            3.9  cm.  Normal appearance by transabdominal scan.  Uterus  Single fibroid noted, see table below.  Left Ovary  No adnexal mass visualized.  Right Ovary  No adnexal mass visualized.  Cul De Sac  No free fluid seen.  Adnexa  No abnormality visualized. ---------------------------------------------------------------------- Myomas   Site                     L(cm)      W(cm)      D(cm)      Location   Anterior                 5          4          5.2  ----------------------------------------------------------------------   Blood Flow                 RI        PI       Comments  ---------------------------------------------------------------------- Comments  This patient was seen for a detailed fetal anatomy scan.  She  denies any problems in her current pregnancy and denies  any significant past medical  history.  She had a cell free DNA test earlier in her pregnancy which  indicated a low risk for trisomy 31, 7, and 13. A female fetus  is predicted.  She was informed that the fetal growth and amniotic fluid  level were appropriate for her gestational age.  Multiple fibroids were noted in the lower uterine segment.  As  the fetal intracranial structures could not be visualized on an  abdominal ultrasound, a transvaginal ultrasound was  performed today.  The fetal cardiac views were unable to be visualized today  due to her fibroid uterus and the fetal position.  The patient was informed that anomalies may be missed due  to technical limitations. If the fetus is in a suboptimal position  or maternal habitus is increased, visualization of the fetus in  the maternal uterus may be impaired.  The increased risk of maternal pain issues and possible fetal  growth issues later in her pregnancy due to the fibroid uterus  was discussed today.  She was advised that we will continue  to follow her closely to assess the fetal growth.  A follow-up exam was scheduled in 4 weeks to obtain better  cardiac views and to assess the fetal growth. ----------------------------------------------------------------------                   Johnell Comings, MD Electronically Signed Final Report   08/20/2019 01:14 pm ----------------------------------------------------------------------   Assessment and Plan:  Pregnancy: G1P0 at [redacted]w[redacted]d 1. Supervision of normal first pregnancy, antepartum Patient is doing well Third trimester labs next visit Follow up ultrasound 11/11  2. Language barrier, cultural differences Arabic interpreter used  Preterm labor symptoms and general obstetric precautions including but not limited to vaginal bleeding, contractions, leaking of fluid and fetal movement were reviewed in detail with the patient. I discussed the assessment and treatment plan with the patient. The patient was provided an opportunity to ask  questions and all were answered. The patient agreed with the plan and demonstrated an understanding of the instructions. The patient was advised to call back or seek an in-person office evaluation/go to MAU at [facility] for any urgent or concerning symptoms. Please refer  to After Visit Summary for other counseling recommendations.   I provided 11 minutes of face-to-face time during this encounter.  Return in about 4 weeks (around 10/13/2019) for in person, ROB, Low risk, 2 hr glucola next visit.  Future Appointments  Date Time Provider [place]  09/17/2019  9:45 AM [place] 2 WH-MFCUS MFC-US    Mora Bellman, MD [facility], [place]

## 2019-09-15 NOTE — Progress Notes (Signed)
Pt states she is having pain in her teeth.  Pt has not seen a dentist during pregnancy.  Advised to find dentist and try to get care during pregnancy.   Pt states her BP machine is not working correctly today.

## 2019-09-17 ENCOUNTER — Other Ambulatory Visit (HOSPITAL_COMMUNITY): Payer: Self-pay | Admitting: *Deleted

## 2019-09-17 ENCOUNTER — Ambulatory Visit (HOSPITAL_COMMUNITY)
Admission: RE | Admit: 2019-09-17 | Discharge: 2019-09-17 | Disposition: A | Discharge status: Home / Self Care | Payer: Medicaid Other | Source: Ambulatory Visit | Attending: Obstetrics | Admitting: Obstetrics

## 2019-09-17 ENCOUNTER — Other Ambulatory Visit: Payer: Self-pay

## 2019-09-17 DIAGNOSIS — O3412 Maternal care for benign tumor of corpus uteri, second trimester: Secondary | ICD-10-CM | POA: Diagnosis not present

## 2019-09-17 DIAGNOSIS — Z3A23 23 weeks gestation of pregnancy: Secondary | ICD-10-CM

## 2019-09-17 DIAGNOSIS — D259 Leiomyoma of uterus, unspecified: Secondary | ICD-10-CM | POA: Diagnosis not present

## 2019-09-17 DIAGNOSIS — Z362 Encounter for other antenatal screening follow-up: Secondary | ICD-10-CM | POA: Insufficient documentation

## 2019-10-13 ENCOUNTER — Encounter: Payer: Self-pay | Admitting: Advanced Practice Midwife

## 2019-10-13 ENCOUNTER — Other Ambulatory Visit: Payer: Medicaid Other

## 2019-10-13 ENCOUNTER — Encounter: Payer: Self-pay | Admitting: Obstetrics

## 2019-10-13 ENCOUNTER — Other Ambulatory Visit: Payer: Self-pay

## 2019-10-13 ENCOUNTER — Ambulatory Visit (INDEPENDENT_AMBULATORY_CARE_PROVIDER_SITE_OTHER): Payer: Medicaid Other | Admitting: Advanced Practice Midwife

## 2019-10-13 VITALS — BP 112/67 | HR 71 | Wt 172.0 lb

## 2019-10-13 DIAGNOSIS — M549 Dorsalgia, unspecified: Secondary | ICD-10-CM

## 2019-10-13 DIAGNOSIS — Z3A27 27 weeks gestation of pregnancy: Secondary | ICD-10-CM

## 2019-10-13 DIAGNOSIS — Z603 Acculturation difficulty: Secondary | ICD-10-CM

## 2019-10-13 DIAGNOSIS — O99891 Other specified diseases and conditions complicating pregnancy: Secondary | ICD-10-CM

## 2019-10-13 DIAGNOSIS — Z3400 Encounter for supervision of normal first pregnancy, unspecified trimester: Secondary | ICD-10-CM

## 2019-10-13 MED ORDER — COMFORT FIT MATERNITY SUPP MED MISC: 1.0000 | Freq: Every day | 0 refills | Status: DC

## 2019-10-13 NOTE — Progress Notes (Signed)
   PRENATAL VISIT NOTE  Subjective:  Betty Crawford is a 23 y.o. G1P0 at [redacted]w[redacted]d being seen today for ongoing prenatal care.The use of an Fish farm manager was utilized for this visit  She is currently monitored for the following issues for this low-risk pregnancy and has Abdominal pain affecting pregnancy; Language barrier, cultural differences; and Supervision of normal first pregnancy, antepartum on their problem list.  Patient reports backache.  Contractions: Not present. Vag. Bleeding: None.  Movement: Present. Denies leaking of fluid.   Patient reports dull right sided flank pain. It has been present for ~1 month and is not associated with urination, position, pain on palpation. Pain has limited her ability to sleep at night. She has not attempted to treat the pain with medication or support. Otherwise she endorses fetal movement and occasional contractions. Denies leaking of fluid, vaginal bleeding/discharge  The following portions of the patient's history were reviewed and updated as appropriate: allergies, current medications, past family history, past medical history, past social history, past surgical history and problem list.   Objective:   Vitals:   10/13/19 0910  BP: 112/67  Pulse: 71  Weight: 172 lb (78 kg)    Fetal Status: Fetal Heart Rate (bpm): 158   Movement: Present     General:  Alert, oriented and cooperative. Patient is in no acute distress.  Skin: Skin is warm and dry. No rash noted.   Cardiovascular: Normal heart rate noted  Respiratory: Normal respiratory effort, no problems with respiration noted  Abdomen: Soft, gravid, appropriate for gestational age.  Pain/Pressure: Present     Pelvic: Cervical exam deferred        Extremities: Normal range of motion.  Edema: None  Mental Status: Normal mood and affect. Normal behavior. Normal judgment and thought content.   Assessment and Plan:  Pregnancy: G1P0 at [redacted]w[redacted]d 1. Supervision of normal first pregnancy, antepartum  Progressing well. Patient reporting fetal movement and occasional uterine contractions. No reported hx of antenatal diabetes. Expectant Management discussed with patient. - 2 Hour GTT - CBC - RPR - HIV antibody  2. Back pain affecting pregnancy in third trimester Patient reporting dull right sided flank pain for 1 months. At this time, given the chronicity and description of pain, it seems to be musculoskeletal in nature. Obtaining urinalysis to rule out infection however this is lower on the differential to be timing, patient's non-sick appearance, denial of dysuria. - Elastic Bandages & Supports (COMFORT FIT MATERNITY SUPP MED) MISC; 1 Device by Does not apply route daily.  Dispense: 1 each; Refill: 0 - Urine Culture-OB - POCT Urinalysis Dipstick  3. Language barrier, cultural differences Use of arabic translator was used for this visit.   4. Dental Tooth Extraction Patient's dentist requesting note clearing patient to have tooth extraction Thursday (10/16/19).  - provided patient with note stating there are no obstetric concerns for dental tooth extraction  Term labor symptoms and general obstetric precautions including but not limited to vaginal bleeding, contractions, leaking of fluid and fetal movement were reviewed in detail with the patient. Please refer to After Visit Summary for other counseling recommendations.   Return in about 2 weeks (around 10/27/2019).  Future Appointments  Date Time Provider [place]  10/16/2019  8:15 AM [facility] [place] 4 [facility]-MFCUS M[facility]-US  10/16/2019  8:20 AM [place] NURSE [place] [facility]-US  10/27/2019  8:55 AM [place], Kathie Dike, CNM [facility] None    Lyn Henri Golda Acre, Medical Student

## 2019-10-13 NOTE — Patient Instructions (Signed)
Back Pain in Pregnancy Back pain during pregnancy is common. Back pain may be caused by several factors that are related to changes during your pregnancy. Follow these instructions at home: Managing pain, stiffness, and swelling      If directed, for sudden (acute) back pain, put ice on the painful area. ? Put ice in a plastic bag. ? Place a towel between your skin and the bag. ? Leave the ice on for 20 minutes, 2-3 times per day.  If directed, apply heat to the affected area before you exercise. Use the heat source that your health care provider recommends, such as a moist heat pack or a heating pad. ? Place a towel between your skin and the heat source. ? Leave the heat on for 20-30 minutes. ? Remove the heat if your skin turns bright red. This is especially important if you are unable to feel pain, heat, or cold. You may have a greater risk of getting burned.  If directed, massage the affected area. Activity  Exercise as told by your health care provider. Gentle exercise is the best way to prevent or manage back pain.  Listen to your body when lifting. If lifting hurts, ask for help or bend your knees. This uses your leg muscles instead of your back muscles.  Squat down when picking up something from the floor. Do not bend over.  Only use bed rest for short periods as told by your health care provider. Bed rest should only be used for the most severe episodes of back pain. Standing, sitting, and lying down  Do not stand in one place for long periods of time.  Use good posture when sitting. Make sure your head rests over your shoulders and is not hanging forward. Use a pillow on your lower back if necessary.  Try sleeping on your side, preferably the left side, with a pregnancy support pillow or 1-2 regular pillows between your legs. ? If you have back pain after a night's rest, your bed may be too soft. ? A firm mattress may provide more support for your back during pregnancy.  General instructions  Do not wear high heels.  Eat a healthy diet. Try to gain weight within your health care provider's recommendations.  Use a maternity girdle, elastic sling, or back brace as told by your health care provider.  Take over-the-counter and prescription medicines only as told by your health care provider.  Work with a physical therapist or massage therapist to find ways to manage back pain. Acupuncture or massage therapy may be helpful.  Keep all follow-up visits as told by your health care provider. This is important. Contact a health care provider if:  Your back pain interferes with your daily activities.  You have increasing pain in other parts of your body. Get help right away if:  You develop numbness, tingling, weakness, or problems with the use of your arms or legs.  You develop severe back pain that is not controlled with medicine.  You have a change in bowel or bladder control.  You develop shortness of breath, dizziness, or you faint.  You develop nausea, vomiting, or sweating.  You have back pain that is a rhythmic, cramping pain similar to labor pains. Labor pain is usually 1-2 minutes apart, lasts for about 1 minute, and involves a bearing down feeling or pressure in your pelvis.  You have back pain and your water breaks or you have vaginal bleeding.  You have back pain or numbness  that travels down your leg.  Your back pain developed after you fell.  You develop pain on one side of your back.  You see blood in your urine.  You develop skin blisters in the area of your back pain. Summary  Back pain may be caused by several factors that are related to changes during your pregnancy.  Follow instructions as told by your health care provider for managing pain, stiffness, and swelling.  Exercise as told by your health care provider. Gentle exercise is the best way to prevent or manage back pain.  Take over-the-counter and prescription  medicines only as told by your health care provider.  Keep all follow-up visits as told by your health care provider. This is important. This information is not intended to replace advice given to you by your health care provider. Make sure you discuss any questions you have with your health care provider. Document Released: 01/31/2006 Document Revised: 02/11/2019 Document Reviewed: 04/10/2018 Elsevier Patient Education  2020 [employer].

## 2019-10-13 NOTE — Progress Notes (Signed)
ROB   CC: Back pain

## 2019-10-14 LAB — CBC
Hematocrit: 38.3 % (ref 34.0–46.6)
Hemoglobin: 12.3 g/dL (ref 11.1–15.9)
MCH: 25.8 pg — ABNORMAL LOW (ref 26.6–33.0)
MCHC: 32.1 g/dL (ref 31.5–35.7)
MCV: 80 fL (ref 79–97)
Platelets: 153 10*3/uL (ref 150–450)
RBC: 4.77 x10E6/uL (ref 3.77–5.28)
RDW: 14.6 % (ref 11.7–15.4)
WBC: 10.5 10*3/uL (ref 3.4–10.8)

## 2019-10-14 LAB — GLUCOSE TOLERANCE, 2 HOURS W/ 1HR
Glucose, 1 hour: 142 mg/dL (ref 65–179)
Glucose, 2 hour: 66 mg/dL (ref 65–152)
Glucose, Fasting: 81 mg/dL (ref 65–91)

## 2019-10-14 LAB — HIV ANTIBODY (ROUTINE TESTING W REFLEX): HIV Screen 4th Generation wRfx: NONREACTIVE

## 2019-10-14 LAB — RPR: RPR Ser Ql: NONREACTIVE

## 2019-10-15 ENCOUNTER — Ambulatory Visit (HOSPITAL_COMMUNITY): Payer: Medicaid Other

## 2019-10-15 LAB — CULTURE, OB URINE

## 2019-10-15 LAB — URINE CULTURE, OB REFLEX

## 2019-10-16 ENCOUNTER — Ambulatory Visit (HOSPITAL_COMMUNITY): Payer: Medicaid Other

## 2019-10-16 ENCOUNTER — Ambulatory Visit (HOSPITAL_COMMUNITY): Admission: RE | Admit: 2019-10-16 | Payer: Medicaid Other | Source: Ambulatory Visit

## 2019-10-27 ENCOUNTER — Other Ambulatory Visit: Payer: Medicaid Other

## 2019-10-27 ENCOUNTER — Encounter: Payer: Medicaid Other | Admitting: Advanced Practice Midwife

## 2019-10-29 ENCOUNTER — Other Ambulatory Visit (HOSPITAL_COMMUNITY): Payer: Self-pay | Admitting: *Deleted

## 2019-10-29 ENCOUNTER — Ambulatory Visit (HOSPITAL_COMMUNITY): Payer: Medicaid Other | Admitting: *Deleted

## 2019-10-29 ENCOUNTER — Other Ambulatory Visit: Payer: Self-pay

## 2019-10-29 ENCOUNTER — Encounter (HOSPITAL_COMMUNITY): Payer: Self-pay

## 2019-10-29 ENCOUNTER — Ambulatory Visit (HOSPITAL_COMMUNITY)
Admission: RE | Admit: 2019-10-29 | Discharge: 2019-10-29 | Disposition: A | Discharge status: Home / Self Care | Payer: Medicaid Other | Source: Ambulatory Visit | Attending: Obstetrics and Gynecology | Admitting: Obstetrics and Gynecology

## 2019-10-29 ENCOUNTER — Encounter: Payer: Self-pay | Admitting: Obstetrics

## 2019-10-29 ENCOUNTER — Ambulatory Visit (INDEPENDENT_AMBULATORY_CARE_PROVIDER_SITE_OTHER): Payer: Medicaid Other | Admitting: Obstetrics

## 2019-10-29 VITALS — BP 116/71 | HR 78 | Ht 64.0 in

## 2019-10-29 DIAGNOSIS — Z603 Acculturation difficulty: Secondary | ICD-10-CM

## 2019-10-29 DIAGNOSIS — R109 Unspecified abdominal pain: Secondary | ICD-10-CM | POA: Insufficient documentation

## 2019-10-29 DIAGNOSIS — O26899 Other specified pregnancy related conditions, unspecified trimester: Secondary | ICD-10-CM

## 2019-10-29 DIAGNOSIS — O3412 Maternal care for benign tumor of corpus uteri, second trimester: Secondary | ICD-10-CM | POA: Diagnosis not present

## 2019-10-29 DIAGNOSIS — D259 Leiomyoma of uterus, unspecified: Secondary | ICD-10-CM

## 2019-10-29 DIAGNOSIS — Z362 Encounter for other antenatal screening follow-up: Secondary | ICD-10-CM | POA: Diagnosis present

## 2019-10-29 DIAGNOSIS — N898 Other specified noninflammatory disorders of vagina: Secondary | ICD-10-CM

## 2019-10-29 DIAGNOSIS — Z3A29 29 weeks gestation of pregnancy: Secondary | ICD-10-CM | POA: Diagnosis not present

## 2019-10-29 DIAGNOSIS — Z3400 Encounter for supervision of normal first pregnancy, unspecified trimester: Secondary | ICD-10-CM

## 2019-10-29 DIAGNOSIS — O26893 Other specified pregnancy related conditions, third trimester: Secondary | ICD-10-CM | POA: Diagnosis not present

## 2019-10-29 MED ORDER — TERCONAZOLE 0.4 % VA CREA: 1.0000 | TOPICAL_CREAM | Freq: Every day | VAGINAL | 0 refills | Status: DC

## 2019-10-29 NOTE — Progress Notes (Signed)
   TELEHEALTH VIRTUAL OBSTETRICS VISIT ENCOUNTER NOTE  I connected with Betty Crawford on 10/29/19 at  3:15 PM EST by telephone at home and verified that I am speaking with the correct person using two identifiers.   I discussed the limitations, risks, security and privacy concerns of performing an evaluation and management service by telephone and the availability of in person appointments. I also discussed with the patient that there may be a patient responsible charge related to this service. The patient expressed understanding and agreed to proceed.  Subjective:  Betty Crawford is a 23 y.o. G1P0 at [redacted]w[redacted]d being followed for ongoing prenatal care.  She is currently monitored for the following issues for this low-risk pregnancy and has Abdominal pain affecting pregnancy; Language barrier, cultural differences; and Supervision of normal first pregnancy, antepartum on their problem list.  Patient reports no complaints. Reports fetal movement. Denies any contractions, bleeding or leaking of fluid.   The following portions of the patient's history were reviewed and updated as appropriate: allergies, current medications, past family history, past medical history, past social history, past surgical history and problem list.   Objective:   General:  Alert, oriented and cooperative.   Mental Status: Normal mood and affect perceived. Normal judgment and thought content.  Rest of physical exam deferred due to type of encounter  Assessment and Plan:  Pregnancy: G1P0 at [redacted]w[redacted]d 1. Supervision of normal first pregnancy, antepartum  2. Vaginal discharge Rx: - terconazole (TERAZOL 7) 0.4 % vaginal cream; Place 1 applicator vaginally at bedtime.  Dispense: 45 g; Refill: 0  3. Language barrier, cultural differences   Preterm labor symptoms and general obstetric precautions including but not limited to vaginal bleeding, contractions, leaking of fluid and fetal movement were reviewed in detail with the  patient.  I discussed the assessment and treatment plan with the patient. The patient was provided an opportunity to ask questions and all were answered. The patient agreed with the plan and demonstrated an understanding of the instructions. The patient was advised to call back or seek an in-person office evaluation/go to MAU at [facility] for any urgent or concerning symptoms. Please refer to After Visit Summary for other counseling recommendations.   I provided 10 minutes of non-face-to-face time during this encounter.  Return in about 2 weeks (around 11/12/2019) for TeleHealth Virtual OB.  Future Appointments  Date Time Provider [place]  10/29/2019  3:15 PM Shelly Bombard, MD [place] None  12/05/2019  8:15 AM [place]    Baltazar Najjar, [place] for [facility], [facility] 10/29/2019

## 2019-10-29 NOTE — Progress Notes (Signed)
[employer] ROB.  C/o yellow discharge x 2 months.  Denies odor, chills, fever, NV.

## 2019-11-07 NOTE — L&D Delivery Note (Addendum)
OB/GYN Faculty Practice Delivery Note  Betty Crawford is a 24 y.o. G1P0 s/p VAVD at [redacted]w[redacted]d. She was admitted for SOL.   ROM: 9h 76m with clear fluid GBS Status: --/NEGATIVE (03/05 0548) Maximum Maternal Temperature: 98.59F  Labor Progress: . Initial SVE: 4/70/-2. Patient received AROM and epidural. She then progressed to complete.   Delivery Date/Time: 3/5 @ 1838 Delivery: Called to room by Lacie Scotts to assess for vacuum delivery due to deep repetitive variables to 50's and 60's.  Patient was examined and found to be fully dilated with fetal station of +3/4 with pushing.  Patient's bladder was noted to be empty, and there were no known fetal contraindications to operative vaginal delivery. EFW was 3000g by [place]/recent ultrasound.   Risks of vacuum assistance were discussed in detail, including but not limited to, bleeding, infection, damage to maternal tissues, fetal cephalohematoma, inability to effect vaginal delivery of the head or shoulder dystocia that cannot be resolved by established maneuvers and need for emergency cesarean section.  Patient gave verbal consent. Dr. Elonda Husky notified.   The soft Kiwi vacuum cup was positioned over the sagittal suture 3 cm anterior to posterior fontanelle.  Pressure was then increased to 500 mmHg, and the patient was instructed to push.  Pulling was administered along the pelvic curve while patient was pushing; there was 1 contraction and 0 popoffs. The infant was then delivered atraumatically in LOP position. Loose nuchal cord present and reduced. Shoulder and body delivered in usual fashion. Infant with spontaneous cry, placed on mother's abdomen, dried and stimulated. Cord clamped x 2 after about 10 seconds due to poor tone. Arterial cord gas drawn. Cord blood drawn. Placenta delivered spontaneously with gentle cord traction. Fundus firm with massage and Pitocin. Labia, perineum, vagina, and cervix inspected inspected with 2nd degree perineal laceration  which was repaired with 3-0 Vicryl in a standard fashion. Baby Weight: pending Arterial Cord pH: 7.169  Placenta: Sent to L&D Complications: Vacuum-assisted Lacerations: 2nd degree perineal laceration EBL: 213 mL Analgesia: Epidural   Infant: APGAR (1 MIN): 8   APGAR (5 MINS): 9   APGAR (10 MINS):     Barrington Ellison, MD OB Family Medicine Fellow, [facility] for [facility], [facility] 01/09/2020, 7:02 PM

## 2019-11-12 ENCOUNTER — Encounter: Payer: Medicaid Other | Admitting: Obstetrics

## 2019-11-14 ENCOUNTER — Other Ambulatory Visit: Payer: Self-pay

## 2019-11-14 ENCOUNTER — Ambulatory Visit (INDEPENDENT_AMBULATORY_CARE_PROVIDER_SITE_OTHER): Payer: Medicaid Other | Admitting: Women's Health

## 2019-11-14 DIAGNOSIS — Z3400 Encounter for supervision of normal first pregnancy, unspecified trimester: Secondary | ICD-10-CM

## 2019-11-14 DIAGNOSIS — O3410 Maternal care for benign tumor of corpus uteri, unspecified trimester: Secondary | ICD-10-CM | POA: Insufficient documentation

## 2019-11-14 DIAGNOSIS — O3413 Maternal care for benign tumor of corpus uteri, third trimester: Secondary | ICD-10-CM | POA: Diagnosis not present

## 2019-11-14 DIAGNOSIS — Z603 Acculturation difficulty: Secondary | ICD-10-CM

## 2019-11-14 DIAGNOSIS — Z3A31 31 weeks gestation of pregnancy: Secondary | ICD-10-CM | POA: Diagnosis not present

## 2019-11-14 DIAGNOSIS — D259 Leiomyoma of uterus, unspecified: Secondary | ICD-10-CM

## 2019-11-14 NOTE — Patient Instructions (Addendum)
Maternity Assessment Unit (MAU)  The Maternity Assessment Unit (MAU) is located at the [facility] and [place] at [facility]. The address is: [address] [place], [place], [place] [address]. Please see map below for additional directions.    The Maternity Assessment Unit is designed to help you during your pregnancy, and for up to 6 weeks after delivery, with any pregnancy- or postpartum-related emergencies, if you think you are in labor, or if your water has broken. For example, if you experience nausea and vomiting, vaginal bleeding, severe abdominal or pelvic pain, elevated blood pressure or other problems related to your pregnancy or postpartum time, please come to the Maternity Assessment Unit for assistance.    Preterm Labor and Birth Information  The normal length of a pregnancy is 39-41 weeks. Preterm labor is when labor starts before 37 completed weeks of pregnancy. What are the risk factors for preterm labor? Preterm labor is more likely to occur in women who:  Have certain infections during pregnancy such as a bladder infection, sexually transmitted infection, or infection inside the uterus (chorioamnionitis).  Have a shorter-than-normal cervix.  Have gone into preterm labor before.  Have had surgery on their cervix.  Are younger than age 46 or older than age 63.  Are African American.  Are pregnant with twins or multiple babies (multiple gestation).  Take street drugs or smoke while pregnant.  Do not gain enough weight while pregnant.  Became pregnant shortly after having been pregnant. What are the symptoms of preterm labor? Symptoms of preterm labor include:  Cramps similar to those that can happen during a menstrual period. The cramps may happen with diarrhea.  Pain in the abdomen or lower back.  Regular uterine contractions that may feel like tightening of the abdomen.  A feeling of increased pressure in the pelvis.  Increased  watery or bloody mucus discharge from the vagina.  Water breaking (ruptured amniotic sac). Why is it important to recognize signs of preterm labor? It is important to recognize signs of preterm labor because babies who are born prematurely may not be fully developed. This can put them at an increased risk for:  Long-term (chronic) heart and lung problems.  Difficulty immediately after birth with regulating body systems, including blood sugar, body temperature, heart rate, and breathing rate.  Bleeding in the brain.  Cerebral palsy.  Learning difficulties.  Death. These risks are highest for babies who are born before 51 weeks of pregnancy. How is preterm labor treated? Treatment depends on the length of your pregnancy, your condition, and the health of your baby. It may involve:  Having a stitch (suture) placed in your cervix to prevent your cervix from opening too early (cerclage).  Taking or being given medicines, such as: ? Hormone medicines. These may be given early in pregnancy to help support the pregnancy. ? Medicine to stop contractions. ? Medicines to help mature the baby's lungs. These may be prescribed if the risk of delivery is high. ? Medicines to prevent your baby from developing cerebral palsy. If the labor happens before 34 weeks of pregnancy, you may need to stay in the hospital. What should I do if I think I am in preterm labor? If you think that you are going into preterm labor, call your health care provider right away. How can I prevent preterm labor in future pregnancies? To increase your chance of having a full-term pregnancy:  Do not use any tobacco products, such as cigarettes, chewing tobacco, and  e-cigarettes. If you need help quitting, ask your health care provider.  Do not use street drugs or medicines that have not been prescribed to you during your pregnancy.  Talk with your health care provider before taking any herbal supplements, even if you  have been taking them regularly.  Make sure you gain a healthy amount of weight during your pregnancy.  Watch for infection. If you think that you might have an infection, get it checked right away.  Make sure to tell your health care provider if you have gone into preterm labor before. This information is not intended to replace advice given to you by your health care provider. Make sure you discuss any questions you have with your health care provider. Document Revised: 02/14/2019 Document Reviewed: 03/15/2016 Elsevier Patient Education  2020 [employer].  Contraception Choices Contraception, also called birth control, refers to methods or devices that prevent pregnancy. Hormonal methods Contraceptive implant  A contraceptive implant is a thin, plastic tube that contains a hormone. It is inserted into the upper part of the arm. It can remain in place for up to 3 years. Progestin-only injections Progestin-only injections are injections of progestin, a synthetic form of the hormone progesterone. They are given every 3 months by a health care provider. Birth control pills  Birth control pills are pills that contain hormones that prevent pregnancy. They must be taken once a day, preferably at the same time each day. Birth control patch  The birth control patch contains hormones that prevent pregnancy. It is placed on the skin and must be changed once a week for three weeks and removed on the fourth week. A prescription is needed to use this method of contraception. Vaginal ring  A vaginal ring contains hormones that prevent pregnancy. It is placed in the vagina for three weeks and removed on the fourth week. After that, the process is repeated with a new ring. A prescription is needed to use this method of contraception. Emergency contraceptive Emergency contraceptives prevent pregnancy after unprotected sex. They come in pill form and can be taken up to 5 days after sex. They work best  the sooner they are taken after having sex. Most emergency contraceptives are available without a prescription. This method should not be used as your only form of birth control. Barrier methods Female condom  A female condom is a thin sheath that is worn over the penis during sex. Condoms keep sperm from going inside a woman's body. They can be used with a spermicide to increase their effectiveness. They should be disposed after a single use. Female condom  A female condom is a soft, loose-fitting sheath that is put into the vagina before sex. The condom keeps sperm from going inside a woman's body. They should be disposed after a single use. Diaphragm  A diaphragm is a soft, dome-shaped barrier. It is inserted into the vagina before sex, along with a spermicide. The diaphragm blocks sperm from entering the uterus, and the spermicide kills sperm. A diaphragm should be left in the vagina for 6-8 hours after sex and removed within 24 hours. A diaphragm is prescribed and fitted by a health care provider. A diaphragm should be replaced every 1-2 years, after giving birth, after gaining more than 15 lb (6.8 kg), and after pelvic surgery. Cervical cap  A cervical cap is a round, soft latex or plastic cup that fits over the cervix. It is inserted into the vagina before sex, along with spermicide. It blocks sperm  from entering the uterus. The cap should be left in place for 6-8 hours after sex and removed within 48 hours. A cervical cap must be prescribed and fitted by a health care provider. It should be replaced every 2 years. Sponge  A sponge is a soft, circular piece of polyurethane foam with spermicide on it. The sponge helps block sperm from entering the uterus, and the spermicide kills sperm. To use it, you make it wet and then insert it into the vagina. It should be inserted before sex, left in for at least 6 hours after sex, and removed and thrown away within 30 hours. Spermicides Spermicides are  chemicals that kill or block sperm from entering the cervix and uterus. They can come as a cream, jelly, suppository, foam, or tablet. A spermicide should be inserted into the vagina with an applicator at least [id number] minutes before sex to allow time for it to work. The process must be repeated every time you have sex. Spermicides do not require a prescription. Intrauterine contraception Intrauterine device (IUD) An IUD is a T-shaped device that is put in a woman's uterus. There are two types:  Hormone IUD.This type contains progestin, a synthetic form of the hormone progesterone. This type can stay in place for 3-5 years.  Copper IUD.This type is wrapped in copper wire. It can stay in place for 10 years.  Permanent methods of contraception Female tubal ligation In this method, a woman's fallopian tubes are sealed, tied, or blocked during surgery to prevent eggs from traveling to the uterus. Hysteroscopic sterilization In this method, a small, flexible insert is placed into each fallopian tube. The inserts cause scar tissue to form in the fallopian tubes and block them, so sperm cannot reach an egg. The procedure takes about 3 months to be effective. Another form of birth control must be used during those 3 months. Female sterilization This is a procedure to tie off the tubes that carry sperm (vasectomy). After the procedure, the man can still ejaculate fluid (semen). Natural planning methods Natural family planning In this method, a couple does not have sex on days when the woman could become pregnant. Calendar method This means keeping track of the length of each menstrual cycle, identifying the days when pregnancy can happen, and not having sex on those days. Ovulation method In this method, a couple avoids sex during ovulation. Symptothermal method This method involves not having sex during ovulation. The woman typically checks for ovulation by watching changes in her temperature and in the  consistency of cervical mucus. Post-ovulation method In this method, a couple waits to have sex until after ovulation. Summary  Contraception, also called birth control, means methods or devices that prevent pregnancy.  Hormonal methods of contraception include implants, injections, pills, patches, vaginal rings, and emergency contraceptives.  Barrier methods of contraception can include female condoms, female condoms, diaphragms, cervical caps, sponges, and spermicides.  There are two types of IUDs (intrauterine devices). An IUD can be put in a woman's uterus to prevent pregnancy for 3-5 years.  Permanent sterilization can be done through a procedure for males, females, or both.  Natural family planning methods involve not having sex on days when the woman could become pregnant. This information is not intended to replace advice given to you by your health care provider. Make sure you discuss any questions you have with your health care provider. Document Revised: 10/25/2017 Document Reviewed: 11/25/2016 Elsevier Patient Education  [place].  https://www.cdc.gov/vaccines/hcp/vis/vis-statements/tdap.pdf">  Tdap (Tetanus,  Diphtheria, Pertussis) Vaccine: What You Need to Know 1. Why get vaccinated? Tdap vaccine can prevent tetanus, diphtheria, and pertussis. Diphtheria and pertussis spread from person to person. Tetanus enters the body through cuts or wounds.  TETANUS (T) causes painful stiffening of the muscles. Tetanus can lead to serious health problems, including being unable to open the mouth, having trouble swallowing and breathing, or death.  DIPHTHERIA (D) can lead to difficulty breathing, heart failure, paralysis, or death.  PERTUSSIS (aP), also known as "whooping cough," can cause uncontrollable, violent coughing which makes it hard to breathe, eat, or drink. Pertussis can be extremely serious in babies and young children, causing pneumonia, convulsions, brain damage, or  death. In teens and adults, it can cause weight loss, loss of bladder control, passing out, and rib fractures from severe coughing. 2. Tdap vaccine Tdap is only for children 7 years and older, adolescents, and adults.  Adolescents should receive a single dose of Tdap, preferably at age 81 or 46 years. Pregnant women should get a dose of Tdap during every pregnancy, to protect the newborn from pertussis. Infants are most at risk for severe, life-threatening complications from pertussis. Adults who have never received Tdap should get a dose of Tdap. Also, adults should receive a booster dose every 10 years, or earlier in the case of a severe and dirty wound or burn. Booster doses can be either Tdap or Td (a different vaccine that protects against tetanus and diphtheria but not pertussis). Tdap may be given at the same time as other vaccines. 3. Talk with your health care provider Tell your vaccine provider if the person getting the vaccine:  Has had an allergic reaction after a previous dose of any vaccine that protects against tetanus, diphtheria, or pertussis, or has any severe, life-threatening allergies.  Has had a coma, decreased level of consciousness, or prolonged seizures within 7 days after a previous dose of any pertussis vaccine (DTP, DTaP, or Tdap).  Has seizures or another nervous system problem.  Has ever had Guillain-Barr Syndrome (also called GBS).  Has had severe pain or swelling after a previous dose of any vaccine that protects against tetanus or diphtheria. In some cases, your health care provider may decide to postpone Tdap vaccination to a future visit.  People with minor illnesses, such as a cold, may be vaccinated. People who are moderately or severely ill should usually wait until they recover before getting Tdap vaccine.  Your health care provider can give you more information. 4. Risks of a vaccine reaction  Pain, redness, or swelling where the shot was given, mild  fever, headache, feeling tired, and nausea, vomiting, diarrhea, or stomachache sometimes happen after Tdap vaccine. People sometimes faint after medical procedures, including vaccination. Tell your provider if you feel dizzy or have vision changes or ringing in the ears.  As with any medicine, there is a very remote chance of a vaccine causing a severe allergic reaction, other serious injury, or death. 5. What if there is a serious problem? An allergic reaction could occur after the vaccinated person leaves the clinic. If you see signs of a severe allergic reaction (hives, swelling of the face and throat, difficulty breathing, a fast heartbeat, dizziness, or weakness), call 9-1-1 and get the person to the nearest hospital. For other signs that concern you, call your health care provider.  Adverse reactions should be reported to the Vaccine Adverse Event Reporting System (VAERS). Your health care provider will usually file this report, or you can  do it yourself. Visit the VAERS website at www.vaers.SamedayNews.es or call 608-609-5156. VAERS is only for reporting reactions, and VAERS staff do not give medical advice. 6. The National Vaccine Injury Compensation Program The [employer] Vaccine Injury Compensation Program (VICP) is a federal program that was created to compensate people who may have been injured by certain vaccines. Visit the VICP website at GoldCloset.com.ee or call 825-271-3530 to learn about the program and about filing a claim. There is a time limit to file a claim for compensation. 7. How can I learn more?  Ask your health care provider.  Call your local or state health department.  Contact the Centers for Disease Control and Prevention (CDC): ? Call 8588376187 (1-800-CDC-INFO) or ? Visit CDC's website at http://hunter.com/ Vaccine Information Statement Tdap (Tetanus, Diphtheria, Pertussis) Vaccine (02/05/2019) This information is not intended to replace advice  given to you by your health care provider. Make sure you discuss any questions you have with your health care provider. Document Revised: 02/14/2019 Document Reviewed: 02/17/2019 Elsevier Patient Education  [place] [facility]  [facility] [address] [place], [place] [address] Phone - [phone]   Fax - [place] [address] [place], [place]  [address] Phone - [phone]   Fax - [phone]  [place]. [address] [place] [address] [place], [place]  [address] Phone - [phone]   Fax - [phone]  [facility] [address] [place], [place]  [address] Phone - [phone]   Fax - [phone]  [place] [address] [place], [place]  [address] Phone - [phone]   Fax - [place] [address], [id number] [place], [place]  [address] Phone - [phone]   Fax - [place] [facility] [place] [address], [place], [place]  [address] Phone - [phone]   Fax - [phone]  [place] [address] [place], [place], [place]  [address] Phone - [phone]   Fax - [place] [address] [place], [place]  [address] Phone - [phone]   Fax - [phone] [facility] [place]. [address] [place], [place]  [address] Phone - [phone]   Fax - [phone]  [place] [address] [place], [place]  [address] Phone - [phone]   Fax - [phone]  [facility] AT [place], [place], [place]  [address] Phone - [phone]   Fax - [place] [address] [place], [place]  [address] Phone - [phone]   Fax - [phone]  [facility] [address], [place], [place]  [address] Phone - [place], [place]  [address] Phone - [phone]   Fax - [place] [address] [place], [place]  [address] Phone - [phone]   Fax - [phone]  [place] [address] [place], [place]  [address] Phone - [phone]   Fax - [place]. [place], [place]  [address] Phone - [phone]   Fax - [place] [address] [place], [place]  [address] Phone - [phone]   Fax - [place]  [address], [place], [place]  [address] Phone - [phone]   Fax - [phone]  [place] [address], [place], [place]  [address] Phone - [phone]   Fax - [place] [place], [place]  [address] Phone - [phone]   Fax - [phone]  [place] [address] [place], [place]  [address] Phone - [phone]   Fax - [phone] [place] [id number] [place], [place]  [address] Phone - [phone]   Fax - [place] [address] [place], [place]  [address] Phone - [phone]   Fax - [place] [address], [place], [place]  [address] Phone - [phone]   Fax - [phone]

## 2019-11-14 NOTE — Progress Notes (Signed)
I connected with Betty Crawford on 11/14/19 at 11:15 AM EST by: phone and verified that I am speaking with the correct person using two identifiers.  Patient is located at home and provider is located at [facility].     The purpose of this virtual visit is to provide medical care while limiting exposure to the novel coronavirus. I discussed the limitations, risks, security and privacy concerns of performing an evaluation and management service by phone and the availability of in person appointments. I also discussed with the patient that there may be a patient responsible charge related to this service. By engaging in this virtual visit, you consent to the provision of healthcare.  Additionally, you authorize for your insurance to be billed for the services provided during this visit.  The patient expressed understanding and agreed to proceed.  The following staff members participated in the virtual visit:  Vernice Jefferson    PRENATAL VISIT NOTE  Subjective:  Betty Crawford is a 24 y.o. G1P0 at [redacted]w[redacted]d  for phone visit for ongoing prenatal care.  She is currently monitored for the following issues for this low-risk pregnancy and has Abdominal pain affecting pregnancy; Language barrier, cultural differences; Supervision of normal first pregnancy, antepartum; and Uterine fibroids affecting pregnancy on their problem list.  Patient reports no complaints.  Contractions: Not present. Vag. Bleeding: None.  Movement: Present. Denies leaking of fluid.   The following portions of the patient's history were reviewed and updated as appropriate: allergies, current medications, past family history, past medical history, past social history, past surgical history and problem list.   Objective:  There were no vitals filed for this visit. Pt unable to take BP at this time because husband is at work. Pt advised to take BP once he gets home and call clinic with results. Pt states she takes BP daily and it has always  been normal. Pt does not remember BP from yesterday.  Fetal Status:     Movement: Present     Assessment and Plan:  Pregnancy: G1P0 at [redacted]w[redacted]d  1. Supervision of normal first pregnancy, antepartum -pt missed appt 10/27/2019 for Tdap, but would like to receive, will schedule nurse visit -discussed contraception, pt declines -peds list given  2. Language barrier, cultural differences -arabic translator used for entire visit, [id number]  3. 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 I discussed the assessment and treatment plan with the patient. The patient was provided an opportunity to ask questions and all were answered. The patient agreed with the plan and demonstrated an understanding of the instructions. The patient was advised to call back or seek an in-person office evaluation/go to [facility] at [facility] for any urgent or concerning symptoms.  Return in about 2 weeks (around 11/28/2019) for virtual ROB, needs nurse only visit for Tdap ASAP.  Future Appointments  Date Time Provider [place]  11/14/2019 11:15 AM Tiawanna Luchsinger, Gerrie Nordmann, NP [facility] None  12/05/2019  8:15 AM [facility] NURSE [facility]  12/05/2019  8:15 AM [facility] [place] 4 [facility]US [facility]-US    Time spent on virtual visit: 15 minutes  Clarisa Fling, NP

## 2019-11-20 ENCOUNTER — Ambulatory Visit: Payer: Medicaid Other

## 2019-11-21 ENCOUNTER — Ambulatory Visit (INDEPENDENT_AMBULATORY_CARE_PROVIDER_SITE_OTHER): Payer: Medicaid Other

## 2019-11-21 ENCOUNTER — Other Ambulatory Visit: Payer: Self-pay

## 2019-11-21 VITALS — BP 130/80 | HR 77 | Wt 180.6 lb

## 2019-11-21 DIAGNOSIS — Z23 Encounter for immunization: Secondary | ICD-10-CM

## 2019-11-21 DIAGNOSIS — Z3400 Encounter for supervision of normal first pregnancy, unspecified trimester: Secondary | ICD-10-CM

## 2019-11-21 NOTE — Progress Notes (Signed)
OB presents for TDAP only.  Injection given in LD, tolerated well.

## 2019-11-28 ENCOUNTER — Telehealth (INDEPENDENT_AMBULATORY_CARE_PROVIDER_SITE_OTHER): Payer: Medicaid Other | Admitting: Obstetrics and Gynecology

## 2019-11-28 VITALS — BP 123/69 | HR 72

## 2019-11-28 DIAGNOSIS — Z3400 Encounter for supervision of normal first pregnancy, unspecified trimester: Secondary | ICD-10-CM

## 2019-11-28 DIAGNOSIS — Z3A33 33 weeks gestation of pregnancy: Secondary | ICD-10-CM | POA: Diagnosis not present

## 2019-11-28 DIAGNOSIS — D259 Leiomyoma of uterus, unspecified: Secondary | ICD-10-CM

## 2019-11-28 DIAGNOSIS — O3413 Maternal care for benign tumor of corpus uteri, third trimester: Secondary | ICD-10-CM

## 2019-11-28 NOTE — Progress Notes (Signed)
S/w pt for virtual visit. Pt reports fetal movement, denies pain. 

## 2019-11-28 NOTE — Progress Notes (Signed)
   [place] VIRTUAL VIDEO VISIT ENCOUNTER NOTE  Provider location: [facility] for [facility] at [place]   I connected with Betty Crawford on 11/30/19 at 11:00 AM EST by WebEx Encounter at home and verified that I am speaking with the correct person using two identifiers.   I discussed the limitations, risks, security and privacy concerns of performing an evaluation and management service virtually and the availability of in person appointments. I also discussed with the patient that there may be a patient responsible charge related to this service. The patient expressed understanding and agreed to proceed. Subjective:  Betty Crawford is a 24 y.o. G1P0 at [redacted]w[redacted]d being seen today for ongoing prenatal care.  She is currently monitored for the following issues for this low-risk pregnancy and has Abdominal pain affecting pregnancy; Language barrier, cultural differences; Supervision of normal first pregnancy, antepartum; and Uterine fibroids affecting pregnancy on their problem list.  Patient reports no complaints.  Contractions: Not present. Vag. Bleeding: None.  Movement: Present. Denies any leaking of fluid.   The following portions of the patient's history were reviewed and updated as appropriate: allergies, current medications, past family history, past medical history, past social history, past surgical history and problem list.   Objective:   Vitals:   11/28/19 1107  BP: 123/69  Pulse: 72    Fetal Status:     Movement: Present     General:  Alert, oriented and cooperative. Patient is in no acute distress.  Respiratory: Normal respiratory effort, no problems with respiration noted  Mental Status: Normal mood and affect. Normal behavior. Normal judgment and thought content.  Rest of physical exam deferred due to type of encounter  Imaging: No results found.  Assessment and Plan:  Pregnancy: G1P0 at [redacted]w[redacted]d  1. Leiomyoma of uterus affecting pregnancy in third  trimester   2. Supervision of normal first pregnancy, antepartum  Doing well BP well today.  + fetal movement.   Preterm labor symptoms and general obstetric precautions including but not limited to vaginal bleeding, contractions, leaking of fluid and fetal movement were reviewed in detail with the patient. I discussed the assessment and treatment plan with the patient. The patient was provided an opportunity to ask questions and all were answered. The patient agreed with the plan and demonstrated an understanding of the instructions. The patient was advised to call back or seek an in-person office evaluation/go to [facility] at [facility] for any urgent or concerning symptoms. Please refer to After Visit Summary for other counseling recommendations.   I provided 9 minutes of face-to-face time during this encounter.  Return in about 2 weeks (around 12/12/2019) for Virtual visit in [place]. .  Future Appointments  Date Time Provider [place]  12/05/2019  8:15 AM [place] [facility]  12/05/2019  8:15 AM [place] 4 [facility]-M[facility]-US  12/12/2019 11:00 AM [place], Rona Ravens, NP [place] None    Noni Saupe, NP [facility], [place]

## 2019-12-05 ENCOUNTER — Ambulatory Visit (HOSPITAL_COMMUNITY)
Admission: RE | Admit: 2019-12-05 | Discharge: 2019-12-05 | Disposition: A | Discharge status: Home / Self Care | Payer: Medicaid Other | Source: Ambulatory Visit | Attending: Obstetrics and Gynecology | Admitting: Obstetrics and Gynecology

## 2019-12-05 ENCOUNTER — Ambulatory Visit (HOSPITAL_COMMUNITY): Payer: Medicaid Other | Admitting: *Deleted

## 2019-12-05 ENCOUNTER — Other Ambulatory Visit: Payer: Self-pay

## 2019-12-05 ENCOUNTER — Encounter (HOSPITAL_COMMUNITY): Payer: Self-pay

## 2019-12-05 VITALS — BP 117/65 | HR 71 | Temp 97.6°F

## 2019-12-05 DIAGNOSIS — O26899 Other specified pregnancy related conditions, unspecified trimester: Secondary | ICD-10-CM | POA: Diagnosis present

## 2019-12-05 DIAGNOSIS — R109 Unspecified abdominal pain: Secondary | ICD-10-CM

## 2019-12-05 DIAGNOSIS — D259 Leiomyoma of uterus, unspecified: Secondary | ICD-10-CM | POA: Diagnosis present

## 2019-12-05 DIAGNOSIS — O3410 Maternal care for benign tumor of corpus uteri, unspecified trimester: Secondary | ICD-10-CM | POA: Diagnosis present

## 2019-12-05 DIAGNOSIS — O3413 Maternal care for benign tumor of corpus uteri, third trimester: Secondary | ICD-10-CM | POA: Insufficient documentation

## 2019-12-05 DIAGNOSIS — Z362 Encounter for other antenatal screening follow-up: Secondary | ICD-10-CM

## 2019-12-05 DIAGNOSIS — Z3A34 34 weeks gestation of pregnancy: Secondary | ICD-10-CM

## 2019-12-12 ENCOUNTER — Encounter: Payer: Self-pay | Admitting: Nurse Practitioner

## 2019-12-12 ENCOUNTER — Telehealth (INDEPENDENT_AMBULATORY_CARE_PROVIDER_SITE_OTHER): Payer: Medicaid Other | Admitting: Nurse Practitioner

## 2019-12-12 VITALS — BP 123/65

## 2019-12-12 DIAGNOSIS — O3413 Maternal care for benign tumor of corpus uteri, third trimester: Secondary | ICD-10-CM | POA: Diagnosis not present

## 2019-12-12 DIAGNOSIS — Z3A35 35 weeks gestation of pregnancy: Secondary | ICD-10-CM | POA: Diagnosis not present

## 2019-12-12 DIAGNOSIS — D259 Leiomyoma of uterus, unspecified: Secondary | ICD-10-CM

## 2019-12-12 DIAGNOSIS — Z3400 Encounter for supervision of normal first pregnancy, unspecified trimester: Secondary | ICD-10-CM

## 2019-12-12 NOTE — Progress Notes (Signed)
Opened in error.   Earlie Server, NP

## 2019-12-12 NOTE — Progress Notes (Signed)
   TELEHEALTH VIRTUAL OBSTETRICS VISIT ENCOUNTER NOTE  I connected with Lisandra Fedrick on 12/12/19 at 11:00 AM EST by telephone at home and verified that I am speaking with the correct person using two identifiers.   I discussed the limitations, risks, security and privacy concerns of performing an evaluation and management service by telephone and the availability of in person appointments. I also discussed with the patient that there may be a patient responsible charge related to this service. The patient expressed understanding and agreed to proceed.  Subjective:  Betty Crawford is a 24 y.o. G1P0 at [redacted]w[redacted]d being followed for ongoing prenatal care.  She is currently monitored for the following issues for this low-risk pregnancy and has Language barrier, cultural differences; Supervision of normal first pregnancy, antepartum; and Uterine fibroids affecting pregnancy on their problem list.  Patient reports no complaints. Reports fetal movement. Denies any contractions, bleeding or leaking of fluid.   The following portions of the patient's history were reviewed and updated as appropriate: allergies, current medications, past family history, past medical history, past social history, past surgical history and problem list.   Objective:   General:  Alert, oriented and cooperative.   Mental Status: Normal mood and affect perceived. Normal judgment and thought content.  Rest of physical exam deferred due to type of encounter BP 123/65 taken at home  Assessment and Plan:  Pregnancy: G1P0 at [redacted]w[redacted]d 1. Leiomyoma of uterus affecting pregnancy, antepartum No pain  2. Supervision of normal first pregnancy, antepartum Taking BP at home.  Had reading for today's visit Phone interpreter present for the entire visit. Return in one week for vaginal swabs - in person visit. Baby moving well No headaches or swelling.  Term labor symptoms and general obstetric precautions including but not limited to  vaginal bleeding, contractions, leaking of fluid and fetal movement were reviewed in detail with the patient.  I discussed the assessment and treatment plan with the patient. The patient was provided an opportunity to ask questions and all were answered. The patient agreed with the plan and demonstrated an understanding of the instructions. The patient was advised to call back or seek an in-person office evaluation/go to [facility]U at [facility] for any urgent or concerning symptoms. Please refer to After Visit Summary for other counseling recommendations.   I provided 6 minutes of non-face-to-face time during this encounter.  Return in about 1 week (around 12/19/2019) for in person ROB and labs.  No future appointments.  Virginia Rochester, NP [facility], [place]

## 2019-12-12 NOTE — Progress Notes (Signed)
ROB    CC: Allergic reaction/ rash itching on neck x 1 month.

## 2020-01-07 ENCOUNTER — Other Ambulatory Visit (HOSPITAL_COMMUNITY)
Admission: RE | Admit: 2020-01-07 | Discharge: 2020-01-07 | Disposition: A | Discharge status: Home / Self Care | Payer: Medicaid Other | Source: Ambulatory Visit | Attending: Nurse Practitioner | Admitting: Nurse Practitioner

## 2020-01-07 ENCOUNTER — Ambulatory Visit (INDEPENDENT_AMBULATORY_CARE_PROVIDER_SITE_OTHER): Payer: Medicaid Other | Admitting: Nurse Practitioner

## 2020-01-07 ENCOUNTER — Other Ambulatory Visit: Payer: Self-pay

## 2020-01-07 ENCOUNTER — Encounter: Payer: Self-pay | Admitting: Nurse Practitioner

## 2020-01-07 VITALS — BP 135/82 | HR 76 | Wt 188.0 lb

## 2020-01-07 DIAGNOSIS — Z3400 Encounter for supervision of normal first pregnancy, unspecified trimester: Secondary | ICD-10-CM | POA: Insufficient documentation

## 2020-01-07 DIAGNOSIS — Z789 Other specified health status: Secondary | ICD-10-CM

## 2020-01-07 DIAGNOSIS — Z3403 Encounter for supervision of normal first pregnancy, third trimester: Secondary | ICD-10-CM

## 2020-01-07 MED ORDER — HYDROCORTISONE 2.5 % EX CREA: TOPICAL_CREAM | Freq: Two times a day (BID) | CUTANEOUS | 0 refills | Status: DC

## 2020-01-07 NOTE — Progress Notes (Signed)
.      Subjective:  Betty Crawford is a 24 y.o. G1P0 at [redacted]w[redacted]d being seen today for ongoing prenatal care.  She is currently monitored for the following issues for this low-risk pregnancy and has Language barrier, cultural differences; Supervision of normal first pregnancy, antepartum; and Uterine fibroids affecting pregnancy on their problem list. Client has not been seen in 4 weeks - has not yet had vaginal swabs done - will do today.   Patient reports reports having some discharge with blood this morning as well as periodic back pain and some abdominal pain that comes and goes..  Contractions: Irregular. Vag. Bleeding: Scant.  Movement: Present. Denies leaking of fluid.   The following portions of the patient's history were reviewed and updated as appropriate: allergies, current medications, past family history, past medical history, past social history, past surgical history and problem list. Problem list updated.  Objective:   Vitals:   01/07/20 0959  BP: 135/82  Pulse: 76  Weight: 188 lb (85.3 kg)    Fetal Status: Fetal Heart Rate (bpm): 138 Fundal Height: 37 cm Movement: Present     General:  Alert, oriented and cooperative. Patient is in no acute distress.  Skin: Skin is warm and dry. No rash noted.   Cardiovascular: Normal heart rate noted  Respiratory: Normal respiratory effort, no problems with respiration noted  Abdomen: Soft, gravid, appropriate for gestational age. Pain/Pressure: Present     Pelvic:  Cervical exam performed Vaginal swabs done - GC/Chlam and GBS        Extremities: Normal range of motion.     Mental Status: Normal mood and affect. Normal behavior. Normal judgment and thought content.   Urinalysis:      Assessment and Plan:  Pregnancy: G1P0 at [redacted]w[redacted]d  1. Supervision of normal first pregnancy, antepartum Based on client's description, likely she los her mucous plug today and is having some contractions. Is not in labor. And no bleeding seen in the office  during vaginal swabs or bimanual exam. Will schedule for induction at 41 weeks. Did not tolerate vaginal exam well today so unsure how she would tolerate outpatient foley bulb placement.  Today cervix was very posterior and could not fully reach cervix.  2. Language barrier In person interpreter here for the entire visit  Term labor symptoms and general obstetric precautions including but not limited to vaginal bleeding, contractions, leaking of fluid and fetal movement were reviewed in detail with the patient. Please refer to After Visit Summary for other counseling recommendations.  Return in about 1 week (around 01/14/2020) for in person ROB.  Earlie Server, RN, MSN, NP-BC Nurse Practitioner, [facility] for [facility], [facility] 01/07/2020 10:36 AM

## 2020-01-07 NOTE — Patient Instructions (Signed)

## 2020-01-08 ENCOUNTER — Encounter (HOSPITAL_COMMUNITY): Payer: Self-pay | Admitting: *Deleted

## 2020-01-08 ENCOUNTER — Telehealth (HOSPITAL_COMMUNITY): Payer: Self-pay | Admitting: *Deleted

## 2020-01-08 LAB — CERVICOVAGINAL ANCILLARY ONLY
Bacterial Vaginitis (gardnerella): NEGATIVE
Candida Glabrata: NEGATIVE
Candida Vaginitis: NEGATIVE
Chlamydia: NEGATIVE
Comment: NEGATIVE
Comment: NEGATIVE
Comment: NEGATIVE
Comment: NEGATIVE
Comment: NEGATIVE
Comment: NORMAL
Neisseria Gonorrhea: NEGATIVE
Trichomonas: NEGATIVE

## 2020-01-08 NOTE — Telephone Encounter (Signed)
Interpreter number [phone]

## 2020-01-09 ENCOUNTER — Encounter (HOSPITAL_COMMUNITY): Payer: Self-pay | Admitting: Obstetrics & Gynecology

## 2020-01-09 ENCOUNTER — Inpatient Hospital Stay (HOSPITAL_COMMUNITY): Payer: Medicaid Other | Admitting: Anesthesiology

## 2020-01-09 ENCOUNTER — Other Ambulatory Visit: Payer: Self-pay

## 2020-01-09 ENCOUNTER — Inpatient Hospital Stay (HOSPITAL_COMMUNITY)
Admission: AD | Admit: 2020-01-09 | Discharge: 2020-01-11 | DRG: 807 | Disposition: A | Discharge status: Home / Self Care | Payer: Medicaid Other | Attending: Family Medicine | Admitting: Family Medicine

## 2020-01-09 DIAGNOSIS — O3413 Maternal care for benign tumor of corpus uteri, third trimester: Secondary | ICD-10-CM | POA: Diagnosis present

## 2020-01-09 DIAGNOSIS — D259 Leiomyoma of uterus, unspecified: Secondary | ICD-10-CM | POA: Diagnosis present

## 2020-01-09 DIAGNOSIS — O480 Post-term pregnancy: Secondary | ICD-10-CM

## 2020-01-09 DIAGNOSIS — O26893 Other specified pregnancy related conditions, third trimester: Secondary | ICD-10-CM | POA: Diagnosis present

## 2020-01-09 DIAGNOSIS — Z3A39 39 weeks gestation of pregnancy: Secondary | ICD-10-CM

## 2020-01-09 DIAGNOSIS — Z3A41 41 weeks gestation of pregnancy: Secondary | ICD-10-CM

## 2020-01-09 DIAGNOSIS — Z20822 Contact with and (suspected) exposure to covid-19: Secondary | ICD-10-CM | POA: Diagnosis present

## 2020-01-09 DIAGNOSIS — Z603 Acculturation difficulty: Secondary | ICD-10-CM

## 2020-01-09 HISTORY — DX: Post-term pregnancy: O48.0

## 2020-01-09 HISTORY — DX: 41 weeks gestation of pregnancy: Z3A.41

## 2020-01-09 LAB — CBC
HCT: 42.7 % (ref 36.0–46.0)
Hemoglobin: 13.8 g/dL (ref 12.0–15.0)
MCH: 25.7 pg — ABNORMAL LOW (ref 26.0–34.0)
MCHC: 32.3 g/dL (ref 30.0–36.0)
MCV: 79.5 fL — ABNORMAL LOW (ref 80.0–100.0)
Platelets: 144 10*3/uL — ABNORMAL LOW (ref 150–400)
RBC: 5.37 MIL/uL — ABNORMAL HIGH (ref 3.87–5.11)
RDW: 15.8 % — ABNORMAL HIGH (ref 11.5–15.5)
WBC: 12.7 10*3/uL — ABNORMAL HIGH (ref 4.0–10.5)
nRBC: 0 % (ref 0.0–0.2)

## 2020-01-09 LAB — RESPIRATORY PANEL BY RT PCR (FLU A&B, COVID)
Influenza A by PCR: NEGATIVE
Influenza B by PCR: NEGATIVE
SARS Coronavirus 2 by RT PCR: NEGATIVE

## 2020-01-09 LAB — GROUP B STREP BY PCR: Group B strep by PCR: NEGATIVE

## 2020-01-09 LAB — TYPE AND SCREEN
ABO/RH(D): O POS
Antibody Screen: NEGATIVE

## 2020-01-09 LAB — RPR: RPR Ser Ql: NONREACTIVE

## 2020-01-09 MED ORDER — OXYCODONE-ACETAMINOPHEN 5-325 MG PO TABS: 2.0000 | ORAL_TABLET | ORAL | Status: DC | PRN

## 2020-01-09 MED ORDER — EPHEDRINE 5 MG/ML INJ: 10.0000 mg | INTRAVENOUS | Status: DC | PRN

## 2020-01-09 MED ORDER — TERBUTALINE SULFATE 1 MG/ML IJ SOLN: 0.2500 mg | Freq: Once | INTRAMUSCULAR | Status: DC | PRN

## 2020-01-09 MED ORDER — OXYTOCIN 40 UNITS IN NORMAL SALINE INFUSION - SIMPLE MED
2.5000 [IU]/h | INTRAVENOUS | Status: DC
  Filled 2020-01-09: qty 1000

## 2020-01-09 MED ORDER — ONDANSETRON HCL 4 MG/2ML IJ SOLN: 4.0000 mg | INTRAMUSCULAR | Status: DC | PRN

## 2020-01-09 MED ORDER — SOD CITRATE-CITRIC ACID 500-334 MG/5ML PO SOLN: 30.0000 mL | ORAL | Status: DC | PRN

## 2020-01-09 MED ORDER — MISOPROSTOL 25 MCG QUARTER TABLET: 25.0000 ug | ORAL_TABLET | ORAL | Status: DC | PRN

## 2020-01-09 MED ORDER — SIMETHICONE 80 MG PO CHEW: 80.0000 mg | CHEWABLE_TABLET | ORAL | Status: DC | PRN

## 2020-01-09 MED ORDER — OXYTOCIN BOLUS FROM INFUSION
500.0000 mL | Freq: Once | INTRAVENOUS | Status: AC
  Administered 2020-01-09: 19:00:00 500 mL via INTRAVENOUS

## 2020-01-09 MED ORDER — ONDANSETRON HCL 4 MG PO TABS: 4.0000 mg | ORAL_TABLET | ORAL | Status: DC | PRN

## 2020-01-09 MED ORDER — SENNOSIDES-DOCUSATE SODIUM 8.6-50 MG PO TABS
2.0000 | ORAL_TABLET | ORAL | Status: DC
  Administered 2020-01-10 – 2020-01-11 (×2): 2 via ORAL
  Filled 2020-01-09 (×2): qty 2

## 2020-01-09 MED ORDER — BENZOCAINE-MENTHOL 20-0.5 % EX AERO
1.0000 "application " | INHALATION_SPRAY | CUTANEOUS | Status: DC | PRN
  Administered 2020-01-10: 1 via TOPICAL
  Filled 2020-01-09: qty 56

## 2020-01-09 MED ORDER — PHENYLEPHRINE 40 MCG/ML (10ML) SYRINGE FOR IV PUSH (FOR BLOOD PRESSURE SUPPORT): 80.0000 ug | PREFILLED_SYRINGE | INTRAVENOUS | Status: DC | PRN

## 2020-01-09 MED ORDER — COCONUT OIL OIL: 1.0000 "application " | TOPICAL_OIL | Status: DC | PRN

## 2020-01-09 MED ORDER — OXYTOCIN 40 UNITS IN NORMAL SALINE INFUSION - SIMPLE MED: 1.0000 m[IU]/min | INTRAVENOUS | Status: DC

## 2020-01-09 MED ORDER — OXYCODONE-ACETAMINOPHEN 5-325 MG PO TABS: 1.0000 | ORAL_TABLET | ORAL | Status: DC | PRN

## 2020-01-09 MED ORDER — PENICILLIN G POT IN DEXTROSE 60000 UNIT/ML IV SOLN: 3.0000 10*6.[IU] | INTRAVENOUS | Status: DC

## 2020-01-09 MED ORDER — ACETAMINOPHEN 325 MG PO TABS: 650.0000 mg | ORAL_TABLET | ORAL | Status: DC | PRN

## 2020-01-09 MED ORDER — SODIUM CHLORIDE 0.9 % IV SOLN
5.0000 10*6.[IU] | Freq: Once | INTRAVENOUS | Status: AC
  Administered 2020-01-09: 07:00:00 5 10*6.[IU] via INTRAVENOUS
  Filled 2020-01-09: qty 5

## 2020-01-09 MED ORDER — WITCH HAZEL-GLYCERIN EX PADS: 1.0000 "application " | MEDICATED_PAD | CUTANEOUS | Status: DC | PRN

## 2020-01-09 MED ORDER — LIDOCAINE HCL (PF) 1 % IJ SOLN: 30.0000 mL | INTRAMUSCULAR | Status: DC | PRN

## 2020-01-09 MED ORDER — MEASLES, MUMPS & RUBELLA VAC IJ SOLR: 0.5000 mL | Freq: Once | INTRAMUSCULAR | Status: DC

## 2020-01-09 MED ORDER — ONDANSETRON HCL 4 MG/2ML IJ SOLN: 4.0000 mg | Freq: Four times a day (QID) | INTRAMUSCULAR | Status: DC | PRN

## 2020-01-09 MED ORDER — LACTATED RINGERS IV SOLN: 500.0000 mL | INTRAVENOUS | Status: DC | PRN

## 2020-01-09 MED ORDER — DIPHENHYDRAMINE HCL 25 MG PO CAPS: 25.0000 mg | ORAL_CAPSULE | Freq: Four times a day (QID) | ORAL | Status: DC | PRN

## 2020-01-09 MED ORDER — TETANUS-DIPHTH-ACELL PERTUSSIS 5-2.5-18.5 LF-MCG/0.5 IM SUSP: 0.5000 mL | Freq: Once | INTRAMUSCULAR | Status: DC

## 2020-01-09 MED ORDER — LACTATED RINGERS IV SOLN
500.0000 mL | Freq: Once | INTRAVENOUS | Status: AC
  Administered 2020-01-09: 500 mL via INTRAVENOUS

## 2020-01-09 MED ORDER — PRENATAL MULTIVITAMIN CH
1.0000 | ORAL_TABLET | Freq: Every day | ORAL | Status: DC
  Administered 2020-01-10 – 2020-01-11 (×2): 1 via ORAL
  Filled 2020-01-09 (×2): qty 1

## 2020-01-09 MED ORDER — FLEET ENEMA 7-19 GM/118ML RE ENEM: 1.0000 | ENEMA | RECTAL | Status: DC | PRN

## 2020-01-09 MED ORDER — ACETAMINOPHEN 325 MG PO TABS
650.0000 mg | ORAL_TABLET | Freq: Four times a day (QID) | ORAL | Status: DC | PRN
  Administered 2020-01-11: 650 mg via ORAL
  Filled 2020-01-09: qty 2

## 2020-01-09 MED ORDER — LIDOCAINE-EPINEPHRINE (PF) 2 %-1:200000 IJ SOLN
INTRAMUSCULAR | Status: DC | PRN
  Administered 2020-01-09 (×2): 2 mL via EPIDURAL

## 2020-01-09 MED ORDER — FENTANYL-BUPIVACAINE-NACL 0.5-0.125-0.9 MG/250ML-% EP SOLN: 12.0000 mL/h | EPIDURAL | Status: DC | PRN

## 2020-01-09 MED ORDER — SODIUM CHLORIDE (PF) 0.9 % IJ SOLN
INTRAMUSCULAR | Status: DC | PRN
  Administered 2020-01-09: 12 mL/h via EPIDURAL

## 2020-01-09 MED ORDER — DIPHENHYDRAMINE HCL 50 MG/ML IJ SOLN: 12.5000 mg | INTRAMUSCULAR | Status: DC | PRN

## 2020-01-09 MED ORDER — FENTANYL CITRATE (PF) 100 MCG/2ML IJ SOLN
100.0000 ug | INTRAMUSCULAR | Status: DC | PRN
  Administered 2020-01-09: 07:00:00 100 ug via INTRAVENOUS
  Filled 2020-01-09: qty 2

## 2020-01-09 MED ORDER — FENTANYL-BUPIVACAINE-NACL 0.5-0.125-0.9 MG/250ML-% EP SOLN
EPIDURAL | Status: AC
  Filled 2020-01-09: qty 250

## 2020-01-09 MED ORDER — IBUPROFEN 600 MG PO TABS
600.0000 mg | ORAL_TABLET | Freq: Three times a day (TID) | ORAL | Status: DC | PRN
  Administered 2020-01-10 – 2020-01-11 (×6): 600 mg via ORAL
  Filled 2020-01-09 (×6): qty 1

## 2020-01-09 MED ORDER — LACTATED RINGERS IV SOLN: INTRAVENOUS | Status: DC

## 2020-01-09 MED ORDER — DIBUCAINE (PERIANAL) 1 % EX OINT: 1.0000 "application " | TOPICAL_OINTMENT | CUTANEOUS | Status: DC | PRN

## 2020-01-09 NOTE — Anesthesia Procedure Notes (Signed)
Epidural Patient location during procedure: OB Start time: 01/09/2020 2:40 PM End time: 01/09/2020 2:55 PM  Staffing Anesthesiologist: Freddrick March, MD Performed: anesthesiologist   Preanesthetic Checklist Completed: patient identified, IV checked, risks and benefits discussed, monitors and equipment checked, pre-op evaluation and timeout performed  Epidural Patient position: sitting Prep: DuraPrep and site prepped and draped Patient monitoring: continuous pulse ox, blood pressure, heart rate and cardiac monitor Approach: midline Location: L3-L4 Injection technique: LOR air  Needle:  Needle type: Tuohy  Needle gauge: 17 G Needle length: 9 cm Needle insertion depth: 5 cm Catheter type: closed end flexible Catheter size: 19 Gauge Catheter at skin depth: 11 cm Test dose: negative  Assessment Sensory level: T8 Events: blood not aspirated, injection not painful, no injection resistance, no paresthesia and negative IV test  Additional Notes Patient identified. Risks/Benefits/Options discussed with patient including but not limited to bleeding, infection, nerve damage, paralysis, failed block, incomplete pain control, headache, blood pressure changes, nausea, vomiting, reactions to medication both or allergic, itching and postpartum back pain. Confirmed with bedside nurse the patient's most recent platelet count. Confirmed with patient that they are not currently taking any anticoagulation, have any bleeding history or any family history of bleeding disorders. Patient expressed understanding and wished to proceed. All questions were answered. Sterile technique was used throughout the entire procedure. Please see nursing notes for vital signs. Test dose was given through epidural catheter and negative prior to continuing to dose epidural or start infusion. Warning signs of high block given to the patient including shortness of breath, tingling/numbness in hands, complete motor block, or  any concerning symptoms with instructions to call for help. Patient was given instructions on fall risk and not to get out of bed. All questions and concerns addressed with instructions to call with any issues or inadequate analgesia.  Reason for block:procedure for pain

## 2020-01-09 NOTE — Discharge Summary (Signed)
Postpartum Discharge Summary      Patient Name: Betty Crawford DOB: [birth year] MRN: [id number]  Date of admission: 01/09/2020 Delivering Provider: Chauncey Mann   Date of discharge: 01/11/2020  Admitting diagnosis: Post term pregnancy at [redacted] weeks gestation [O48.0, Z3A.41] Intrauterine pregnancy: [redacted]w[redacted]d    Secondary diagnosis:  Active Problems:   Post term pregnancy at [redacted]weeks gestation   Vacuum-assisted vaginal delivery  Additional problems: None     Discharge diagnosis: Term Pregnancy Delivered                                                                                                Post partum procedures:None  Augmentation: AROM  Complications: None  Hospital course:  Onset of Labor With Vaginal Delivery     24y.o. yo G1P0 at 344w4das admitted in Latent Labor on 01/09/2020. Patient had an uncomplicated labor course as follows: Initial SVE: 4/70/-2. Patient received AROM and epidural. She then progressed to complete. Due to repetitive deep variables; delivery by vacuum-assistance. Membrane Rupture Time/Date: 9:23 AM ,01/09/2020   Intrapartum Procedures: Episiotomy: None [1]                                         Lacerations:  2nd degree [3];Perineal [11]  Patient had a delivery of a Viable infant. 01/09/2020  Information for the patient's newborn:  BeEmmagene, Ortner0[[id number]]Delivery Method: Vaginal, Vacuum (Extractor)(Filed from Delivery Summary)     Pateint had an uncomplicated postpartum course.  She is ambulating, tolerating a regular diet, passing flatus, and urinating well. Patient is discharged home in stable condition on 01/11/20.  Delivery time: 6:38 PM    Magnesium Sulfate received: No BMZ received: No Rhophylac:No MMR:No Transfusion:No  Physical exam  Vitals:   01/10/20 1100 01/10/20 1630 01/10/20 2100 01/11/20 0550  BP: 117/66 128/80 116/70 (!) 119/57  Pulse: 70 76 67 60  Resp: '20 20 18 18  '$ Temp: 98 F (36.7 C) 98.3 F (36.8 C) 98 F  (36.7 C) 98 F (36.7 C)  TempSrc:   Oral Oral  SpO2:      Weight:      Height:       General: alert, cooperative and no distress Lochia: appropriate Uterine Fundus: firm Incision: N/A DVT Evaluation: No evidence of DVT seen on physical exam. Labs: Lab Results  Component Value Date   WBC 12.7 (H) 01/09/2020   HGB 13.8 01/09/2020   HCT 42.7 01/09/2020   MCV 79.5 (L) 01/09/2020   PLT 144 (L) 01/09/2020   No flowsheet data found. Edinburgh Score: Edinburgh Postnatal Depression Scale Screening Tool 01/10/2020  I have been able to laugh and see the funny side of things. 0  I have looked forward with enjoyment to things. 0  I have blamed myself unnecessarily when things went wrong. 0  I have been anxious or worried for no good reason. 0  I have felt scared or panicky for no good reason. 0  Things have been  getting on top of me. 0  I have been so unhappy that I have had difficulty sleeping. 0  I have felt sad or miserable. 0  I have been so unhappy that I have been crying. 0  The thought of harming myself has occurred to me. 0  Edinburgh Postnatal Depression Scale Total 0    Discharge instruction: per After Visit Summary and "Baby and Me Booklet".  After visit meds:  Allergies as of 01/11/2020   No Known Allergies     Medication List    STOP taking these medications   Blood Pressure Kit [place] Supp Med Misc     TAKE these medications   calcium carbonate 500 MG chewable tablet Commonly known as: Tums Chew 1 tablet (200 mg of elemental calcium total) by mouth every 8 (eight) hours as needed for indigestion or heartburn.   hydrocortisone 2.5 % cream Apply topically 2 (two) times daily.   ibuprofen 600 MG tablet Commonly known as: ADVIL Take 1 tablet (600 mg total) by mouth every 8 (eight) hours as needed for mild pain.   multivitamin-prenatal 27-0.8 MG Tabs tablet Take 1 tablet by mouth daily at 12 noon.       Diet: routine  diet  Activity: Advance as tolerated. Pelvic rest for 6 weeks.   Outpatient follow up:4 weeks Follow up Appt: Future Appointments  Date Time Provider [place]  01/13/2020  9:15 AM Sparacino, Hailey L, DO [facility] None  01/17/2020  9:30 AM [facility] None   Follow up Visit: [place] Follow up.   Why: In 4-6 weeks for postpartum visit.  Contact information: [place] [place] [id number] [phone]            Please schedule this patient for Postpartum visit in: 4 weeks with the following provider: Any provider Virtual For C/S patients schedule nurse incision check in weeks 2 weeks: no Low risk pregnancy complicated by: none Delivery mode:  Vacuum Anticipated Birth Control:  other/unsure PP Procedures needed: None  Schedule Integrated BH visit: no     Newborn Data: Live born female  Birth Weight: 2585g/5lb 11.2 oz APGAR: 8, 9  Newborn Delivery   Birth date/time: 01/09/2020 18:38:00 Delivery type: Vaginal, Vacuum (Extractor)      Baby Feeding: Breast Disposition:home with mother   01/11/2020 Fatima Blank, CNM

## 2020-01-09 NOTE — Progress Notes (Signed)
Patient ID: Betty Crawford, female   DOB: 10-09-1996, 24 y.o.   MRN: [id number]  Breathing w/ ctx; states they are about the same intensity as earlier (through husband as translator)  BP 121/68, P 62 FHR 120s, +accels, no decels, Cat 1 Ctx irreg 4-7 mins Cx 6/80/vtx -2; membrane tight against head, but AROM for small clear fluid  IUP@term  Early active labor GBS neg  Expectant management Anticipate vag del  Myrtis Ser [facility] 01/09/2020 9:51 AM

## 2020-01-09 NOTE — Anesthesia Preprocedure Evaluation (Signed)

## 2020-01-09 NOTE — Progress Notes (Addendum)
During a brief interview, the patient reports that neither she or her family members have a history of problems with anesthesia, including MH. Her airway is a MP 2. Patient reports no pertinent medical history or problems with this pregnancy. Any patient question and/or concerns were addressed.  She was informed that the anesthesia team is available to her 24/7 if she needs [place] or has further questions.  The patient speaks Arabic with limited [place].  Her husband speaks [place].

## 2020-01-09 NOTE — Progress Notes (Signed)
Patient ID: Betty Crawford, female   DOB: [birth year], 24 y.o.   MRN: [id number] Labor Progress Note Betty Crawford is a 24 y.o. G1P0 at [redacted]w[redacted]d presented for SOL. S: Pt reports pressure in the lower abdomen. Comfortable with epidural. (through husband as Optometrist)  O:  BP 121/85   Pulse (!) 129   Temp 98.2 F (36.8 C) (Oral)   Resp 16   Ht 5' (1.524 m)   Wt 85.7 kg   LMP 04/07/2019   BMI 36.91 kg/m  FHR: 135-145 Ctx: not tracing well  CVE: Dilation: 10 Dilation Complete Date: 01/09/20 Dilation Complete Time: 1654 Effacement (%): 100 Cervical Position: Middle Station: Plus 1 Presentation: Vertex Exam by:: Dory Peru RN   A&P: 24 y.o. G1P0 [redacted]w[redacted]d #Labor: Progressing well. #Pain: Epidural #GBS negative  Expectant management Anticipate vag delivery  Bruna Potter, Medical Student 5:31 PM

## 2020-01-09 NOTE — H&P (Addendum)
OBSTETRIC ADMISSION HISTORY AND PHYSICAL  Betty Crawford is a 24 y.o. female G1P0 with IUP at 92w4dby LMP c/w early u/s presenting for SOL. She reports +FMs, No LOF, no blurry vision, headaches or peripheral edema, and RUQ pain.  She plans on breast feeding. She is unsure if she would like birth control at this time. [facility]   Patient communicated with via iPad interpretor.   Dating: By LMP c/w early u[place]--->  Estimated Date of Delivery: 01/12/20  Sono:   _0 , CWD, normal anatomy, cephalic presentation, 20762U 13% EFW   Prenatal History/Complications: Uterine fibroids affecting pregnancy Language barrier  Past Medical History: Past Medical History:  Diagnosis Date   Fibroid     Past Surgical History: Past Surgical History:  Procedure Laterality Date   NO PAST SURGERIES      Obstetrical History: OB History     Gravida  1   Para      Term      Preterm      AB      Living         SAB      TAB      Ectopic      Multiple      Live Births              Social History Social History   Socioeconomic History   Marital status: Married    Spouse name: Not on file   Number of children: Not on file   Years of education: Not on file   Highest education level: Not on file  Occupational History   Not on file  Tobacco Use   Smoking status: Never Smoker   Smokeless tobacco: Never Used  Substance and Sexual Activity   Alcohol use: Never   Drug use: Never   Sexual activity: Yes  Other Topics Concern   Not on file  Social History Narrative   Not on file   Social Determinants of Health   Financial Resource Strain:    Difficulty of Paying Living Expenses: Not on file  Food Insecurity:    Worried About Running Out of Food in the Last Year: Not on file   Ran Out of Food in the Last Year: Not on file  Transportation Needs:    Lack of Transportation (Medical): Not on file   Lack of Transportation (Non-Medical): Not  on file  Physical Activity:    Days of Exercise per Week: Not on file   Minutes of Exercise per Session: Not on file  Stress:    Feeling of Stress : Not on file  Social Connections:    Frequency of Communication with Friends and Family: Not on file   Frequency of Social Gatherings with Friends and Family: Not on file   Attends Religious Services: Not on file   Active Member of Clubs or Organizations: Not on file   Attends CArchivistMeetings: Not on file   Marital Status: Not on file    Family History: History reviewed. No pertinent family history.  Allergies: No Known Allergies  Medications Prior to Admission  Medication Sig Dispense Refill Last Dose   Prenatal Vit-Fe Fumarate-FA (MULTIVITAMIN-PRENATAL) 27-0.8 MG TABS tablet Take 1 tablet by mouth daily at 12 noon.   01/08/2020 at Unknown time   Blood Pressure Monitoring (BLOOD PRESSURE KIT) DEVI 1 kit by Does not apply route as needed. 1 kit 0 Unknown at Unknown time  calcium carbonate (TUMS) 500 MG chewable tablet Chew 1 tablet (200 mg of elemental calcium total) by mouth every 8 (eight) hours as needed for indigestion or heartburn. 30 tablet 1 Unknown at Unknown time   Elastic Bandages & Supports (COMFORT FIT MATERNITY SUPP MED) MISC 1 Device by Does not apply route daily. 1 each 0 Unknown at Unknown time   hydrocortisone 2.5 % cream Apply topically 2 (two) times daily. 30 g 0 Unknown at Unknown time     Review of Systems   All systems reviewed and negative except as stated in HPI  Blood pressure 127/76, pulse 74, temperature 98.3 F (36.8 C), resp. rate 18, height 5' (1.524 m), weight 85.7 kg, last menstrual period 04/07/2019. General appearance: alert, cooperative and no distress Lungs: clear to auscultation bilaterally Heart: regular rate and rhythm Abdomen: soft, non-tender; bowel sounds normal Pelvic: Deferred Extremities: Homans sign is negative, no sign of DVT Presentation: cephalic Fetal  monitoringBaseline: 125 bpm, Variability: Good {> 6 bpm) and Accelerations: present Uterine activity some mild contractions at varying intervals Dilation: 5.5 Effacement (%): 80 Station: -2 Exam by:: Patricia Pesa, RN   Prenatal labs: ABO, Rh: --/--/PENDING (03/05 9201) Antibody: PENDING (03/05 0554) Rubella: 29.00 (09/14 1419) RPR: Non Reactive (12/07 1016)  HBsAg: Negative (09/14 1419)  HIV: Non Reactive (12/07 1016)  GBS:   Unsure 1 hr Glucola 142 Genetic screening  Horizon negative Anatomy US Normal, but limited due to fibroids  Prenatal Transfer Tool  Maternal Diabetes: No Genetic Screening: Normal Maternal Ultrasounds/Referrals: Normal but with maternal fibroids Fetal Ultrasounds or other Referrals:  None Maternal Substance Abuse:  No Significant Maternal Medications:  None Significant Maternal Lab Results: Other:  GBS unsure  Results for orders placed or performed during the hospital encounter of 01/09/20 (from the past 24 hour(s))  CBC   Collection Time: 01/09/20  5:44 AM  Result Value Ref Range   WBC 12.7 (H) 4.0 - 10.5 K/uL   RBC 5.37 (H) 3.87 - 5.11 MIL/uL   Hemoglobin 13.8 12.0 - 15.0 g/dL   HCT 42.7 36.0 - 46.0 %   MCV 79.5 (L) 80.0 - 100.0 fL   MCH 25.7 (L) 26.0 - 34.0 pg   MCHC 32.3 30.0 - 36.0 g/dL   RDW 15.8 (H) 11.5 - 15.5 %   Platelets 144 (L) 150 - 400 K/uL   nRBC 0.0 0.0 - 0.2 %  Type and screen   Collection Time: 01/09/20  5:54 AM  Result Value Ref Range   ABO/RH(D) PENDING    Antibody Screen PENDING    Sample Expiration      01/12/2020,2359 Performed at [facility], [address], [place], [place] [address]     Patient Active Problem List   Diagnosis Date Noted   Post term pregnancy at [redacted] weeks gestation 01/09/2020   Uterine fibroids affecting pregnancy 11/14/2019   Supervision of normal first pregnancy, antepartum 07/21/2019   Language barrier, cultural differences 06/27/2019    Assessment/Plan:  Betty Crawford is a 24 y.o.  G1P0 at 80w4dhere for SOL.  #Labor: Expectant management pending treatment of unknown GBS status. #Pain: Per patient request #FWB: Cat I #ID: GBS unsure - PCN. GBS PCR pending. #MOF: Breast #MOC: Unsure  Anticipate vaginal delivery  RLurline Del DO  01/09/2020, 6:39 AM  GME ATTESTATION:  I saw and evaluated the patient. I agree with the findings and the plan of care as documented in the resident's note.  HMerilyn Baba DO OB Fellow,  [place] for [place] 01/09/2020 7:01 AM

## 2020-01-09 NOTE — Progress Notes (Signed)
Patient ID: Betty Crawford, female   DOB: 1996-06-05, 24 y.o.   MRN: [id number]  Labor Progress Note Betty Crawford is a 24 y.o. G1P0 at [redacted]w[redacted]d presented for SOL. S: Pt reports feeling pressure starting at 1130 (through husband as Optometrist).  O:  BP (!) 110/57   Pulse 75   Temp 98.1 F (36.7 C) (Oral)   Resp 16   Ht 5' (1.524 m)   Wt 85.7 kg   LMP 04/07/2019   BMI 36.91 kg/m  FHR: 120s, +accels, no decels, Cat 1 Ctx: not tracing well due to pt activity  CVE: Dilation: 7 Effacement (%): 80 Station: -1 Presentation: Vertex Exam by:: Remus Blake RN   A&P: 24 y.o. G1P0 [redacted]w[redacted]d #Labor: Progressing well.  #GBS negative   Expectant management Anticipate vag del  [name], Medical Student 12:31 PM

## 2020-01-10 NOTE — Progress Notes (Signed)
Post Partum Day #1 Subjective: no complaints, up ad lib and tolerating PO; breastfeeding going well; unsure re contraception  Objective: Blood pressure 115/65, pulse 61, temperature 98.3 F (36.8 C), temperature source Oral, resp. rate 18, height 5' (1.524 m), weight 85.7 kg, last menstrual period 04/07/2019, SpO2 99 %, unknown if currently breastfeeding.  Physical Exam:  General: alert, cooperative and no distress Lochia: appropriate Uterine Fundus: firm DVT Evaluation: No evidence of DVT seen on physical exam.  Recent Labs    01/09/20 0544  HGB 13.8  HCT 42.7    Assessment/Plan: Plan for discharge tomorrow   LOS: 1 day   Myrtis Ser CNM 01/10/2020, 7:42 AM

## 2020-01-10 NOTE — Lactation Note (Signed)
This note was copied from a baby's chart. Lactation Consultation Note  Patient Name: Betty Crawford [id number] Date: 01/10/2020 Reason for consult: Initial assessment;Primapara;Term   [phone] Initial visit with P101 mom, baby is now 58hrs old, birth weight <6lbs. Mom needs interpreter for Arabic language.  LC entered room to find mom and significant other at bedside, dressing baby. FOB states he speaks [place] and can translate however language line utilized. Female interpreter "Has" [phone] first answered request and mom instantly withdrew physically. Via interpreter, asked mom if she would prefer female interpreter and mom confirms. Female interpreter unavailable at this time per Has.  Attempted to utilize FOB at bedside to translate but he did not convey my questions to mom but answered for her instead. Parents notified that [facility] will return later when female interpreter available.  LC followed up with MB RN Susie who states mom is struggling with achieving deep latch and has flat nipples; has been given nipple shield. RN reports similar experience with FOB's hesitancy to translate between mom and nursing staff. Notified RN that [facility] will try again later.  Consult Status Consult Status: Follow-up Date: 01/10/20 Follow-up type: In-patient    Cranston Neighbor 01/10/2020, 12:39 PM

## 2020-01-10 NOTE — Lactation Note (Signed)
This note was copied from a baby's chart. Lactation Consultation Note  Patient Name: Betty Crawford Today's Date: 01/10/2020 Reason for consult: Initial assessment;1st time breastfeeding;Primapara;Term  **Mom requests female interpreter for AMN communication services**  [phone] Initial visit with P46 mom, baby is now 75 hours old.   Female interpreter "Mays" [phone] utilized for this interaction.  LC entered room to find mom and dad at bedside, baby in bassinet stirring at times. Mom states baby fed approximately 30 minutes ago for about 15 min. When asked, mom states she does experience some pain with latching at times and baby does not latch deeply into the breast.  Mom denies any changes in her breast during the pregnancy. Mom with small breasts but erect nipple. Mom verbalizes understanding of hand expression as demonstrated by other staff members.  Reviewed basic latch techniques re: bringing baby to the level of the breast, waiting for the baby to open her mouth as wide as possible, flanged lips, nose/chin touching the breast during the feeding. Reviewed different positions/holds for baby.  Reviewed feeding 8-12 times in 24 hours and with feeding cues; feeding cues reviewed.  Reviewed monitoring for wet diapers by checking the blue indicator stripe with diaper changes.  Infant stirring some and smacking lips during interaction. LC offered to assist while present and interpreter available. Mom declines, states baby is sleeping.  Lactation brochure with phone numbers left at bedside; reviewed IP/OP lactation services and virtual support group information on [email or web address]  Mom with questions about giving her baby formula. Asked mom about her goals/intentions with breastfeeding and mom states she wants to breastfeed if she can but if she doesn't have enough milk she wants her baby to have formula. Strongly encouraged mom to call for assistance the next time she feeds baby so  that latch/technique may be assessed before offering a bottle/formula. Parents verbalized understanding.  Interventions Interventions: Breast feeding basics reviewed;Skin to skin;Breast massage;Hand express;Position options;Support pillows   Consult Status Consult Status: Follow-up Date: 01/11/20 Follow-up type: In-patient    Betty Crawford 01/10/2020, 4:08 PM

## 2020-01-10 NOTE — Anesthesia Postprocedure Evaluation (Signed)
Anesthesia Post Note  Patient: Betty Crawford  Procedure(s) Performed: AN AD HOC LABOR EPIDURAL     Patient location during evaluation: Mother Baby Anesthesia Type: Epidural Level of consciousness: awake and alert Pain management: pain level controlled Vital Signs Assessment: post-procedure vital signs reviewed and stable Respiratory status: spontaneous breathing, nonlabored ventilation and respiratory function stable Cardiovascular status: stable Postop Assessment: no headache, no backache and epidural receding Anesthetic complications: no Comments: Per conversation with RN    Last Vitals:  Vitals:   01/10/20 0200 01/10/20 0530  BP: 129/80 115/65  Pulse: 74 61  Resp: 18 18  Temp: 37 C 36.8 C  SpO2: 97% 99%    Last Pain:  Vitals:   01/10/20 0710  TempSrc:   PainSc: 0-No pain   Pain Goal: Patients Stated Pain Goal: 3 (01/10/20 0049)                 [name]

## 2020-01-11 ENCOUNTER — Ambulatory Visit: Payer: Self-pay

## 2020-01-11 LAB — CULTURE, BETA STREP (GROUP B ONLY): Strep Gp B Culture: NEGATIVE

## 2020-01-11 MED ORDER — IBUPROFEN 600 MG PO TABS: 600.0000 mg | ORAL_TABLET | Freq: Three times a day (TID) | ORAL | 0 refills | Status: DC | PRN

## 2020-01-11 NOTE — Lactation Note (Addendum)
This note was copied from a baby's chart. Lactation Consultation Note  Patient Name: Betty Crawford [id number] Date: 01/11/2020 - Arabic- dad signed consent.   Baby  is 4 hours old , 4 % weight loss  34 hours - Bili 9.8  As LC entered the baby per dad baby latched since 0830 - 20 mins.  Baby released , LC changed a wet diaper and assisted with support and  Depth on the same breast and baby fed for 15 mins and released.  LC noted areola edema - indication for shells . Per mom feeding comfortable.  LC assisted mom to latch on the right breast / football / and latched with depth  And wide open mouth. Multiple swallows increased with breast compressions.  Baby fed for 10 mins . Both breast are filling and warm , softened with feeding.  LC recommended prior to every feeding until nipple more erect and areola more compressible after breast massage, hand express, pre - pump to latch with firm  Support and compressions as shown. LC stressed the importance of STS feedings until the baby is back to birth weight , gaining steadily and can stay awake for majority of feeding.  Shells between feedings except when sleeping until areolas more compressible.  Sore nipple and engorgement prevention and tx reviewed. Mom has a shells and hand Pump for D/C .  LC provided the [facility] health pamphlet with phone numbers.     Maternal Data    Feeding Feeding Type: Breast Fed  LATCH Score Latch: Grasps breast easily, tongue down, lips flanged, rhythmical sucking.  Audible Swallowing: Spontaneous and intermittent  Type of Nipple: Everted at rest and after stimulation  Comfort (Breast/Nipple): Filling, red/small blisters or bruises, mild/mod discomfort  Hold (Positioning): Assistance needed to correctly position infant at breast and maintain latch.  LATCH Score: 8  Interventions Interventions: Breast feeding basics reviewed;Assisted with latch;Skin to skin;Breast massage;Hand express;Reverse  pressure;Breast compression  Lactation Tools Discussed/Used     Consult Status Consult Status: Complete    Jerlyn Ly Alisa Stjames 01/11/2020, 9:12 AM

## 2020-01-11 NOTE — Lactation Note (Signed)
This note was copied from a baby's chart. Lactation Consultation Note  Patient Name: Betty Crawford [id number] Date: 01/11/2020 Reason for consult: Follow-up assessment;Primapara;1st time breastfeeding;Mother's request;Infant weight loss;Other (Comment);Infant < 6lbs;Term(baby pt)  Dad is the assigned interpreter for Arabic, he signed the release form.  Visited with mom of a 103 hours old FT female who is being exclusively BF, baby is < 6 lbs and at 4% weight loss. Mom finishing up BF baby when entering the room, she was doing the side lying position, but baby already falling asleep. Spoke to mom about baby's feeding patter and pumping.   Mom was given a hand pump to take home but since she didn't get to go home today, baby became a baby Pt. This baby has not been supplemented nor mom has started pumping. When assessing mom's breast, noticed that they're starting to fill in, since baby is small, she may not be fully emptying the breast.  Explained to parents the importance of starting pumping to prevent engorgement and to protect mom's milk supply. LC assisted mom with the hand pump she had in the room, it took her about 15 minutes to get 20 ml of EBM, LC assisted parents with the feeding, offering spoon feeding but parents preferred a slow flow nipple, they already had one in the room. Baby took 15 ml of her EBM, instructed dad to burp baby and offer the remainder amount (about 5 ml) once she's done.  Offered a DEBP and parents agree, they also understand that this is something she may have to do for a few more weeks until baby catches up with her weight and can fully empty the breast on her own. Pump instructions, cleaning and storage were reviewed, as well as milk storage guidelines. Mom will continue putting baby to breast and will also pump in addition of offering the breast.   Feeding plan:  1. Encouraged mom to feed baby STS 8-12 times/24 hours or sooner if feeding cues are present 2. Hand  expression and spoon feeding were also encouraged, parents will use a slow flow nipple though for supplemental feedings of EBM 3. She'll pump every 3 hours after feedings and will offer any amount of EBM she may get to baby.  Parents were very thankful for all the teaching and explanation provided regarding their FT < 6 lbs baby Betty. They reported all questions and concerns were answered, they're both aware of [place] OP services and will call PRN.   Maternal Data    Feeding Feeding Type: Breast Milk Nipple Type: Slow - flow  LATCH Score Latch: Repeated attempts needed to sustain latch, nipple held in mouth throughout feeding, stimulation needed to elicit sucking reflex.  Audible Swallowing: A few with stimulation  Type of Nipple: Flat  Comfort (Breast/Nipple): Soft / non-tender  Hold (Positioning): Assistance needed to correctly position infant at breast and maintain latch.  LATCH Score: 6  Interventions Interventions: Breast feeding basics reviewed;Hand express;DEBP;Hand pump;Breast massage  Lactation Tools Discussed/Used Tools: Pump Breast pump type: Double-Electric Breast Pump;Manual Pump Review: Setup, frequency, and cleaning;Milk Storage Initiated by:: MPeck Date initiated:: 01/11/20   Consult Status Consult Status: Follow-up Date: 01/12/20 Follow-up type: In-patient    Pinky Ravan Francene Boyers 01/11/2020, 6:32 PM

## 2020-01-12 ENCOUNTER — Ambulatory Visit: Payer: Self-pay

## 2020-01-12 NOTE — Lactation Note (Signed)
This note was copied from a baby's chart. Lactation Consultation Note  Patient Name: Betty Crawford [id number] Date: 01/12/2020 Reason for consult: Follow-up assessment;Primapara;1st time breastfeeding;Term;Infant weight loss;Other (Comment);Nipple pain/trauma(4 % weight loss)  - Dad signed the consent to be the interpreter for mom. - Arabic  Baby is 60 hours old  As LC entered the room mom in the bathroom and dad reviewed the doc flow sheets  With this [place] and updated.  Per dad mom has not used the DEBP since she pumped with the [facility] and has used the hand pump and she got just as much milk .  Per mom per dad sore nipples are better today. LC offered to assess / reviewed  Hand expressing/ and the scabbed areas are gone and both nipples appear healthier.  Mom has been using breast shells. LC provided comfort gels x 6 days for after feedings and alternating with shells.  Mom mentioned via dad latching is so much better since yesterday.  LC recommended what every milk pumped off to feed it back to baby.  LC explained jaundice at dads request.  LC provided the [facility] pamphlet with phone numbers yesterday.  Serum Bilirubin pending.    Maternal Data Has patient been taught Hand Expression?: Yes  Feeding Feeding Type: (per dad baby last fed at 830 am)  LATCH Score                   Interventions Interventions: Breast feeding basics reviewed;Shells;Comfort gels;Hand pump  Lactation Tools Discussed/Used Tools: Shells;Pump;Comfort gels Shell Type: Inverted Breast pump type: Manual;Double-Electric Breast Pump   Consult Status Consult Status: Complete Date: 01/12/20    Myer Haff 01/12/2020, 9:26 AM

## 2020-01-13 ENCOUNTER — Encounter: Payer: Medicaid Other | Admitting: Family Medicine

## 2020-01-17 ENCOUNTER — Other Ambulatory Visit (HOSPITAL_COMMUNITY): Admission: RE | Admit: 2020-01-17 | Payer: Medicaid Other | Source: Ambulatory Visit

## 2020-01-19 ENCOUNTER — Inpatient Hospital Stay (HOSPITAL_COMMUNITY): Payer: Medicaid Other

## 2020-02-10 ENCOUNTER — Telehealth (INDEPENDENT_AMBULATORY_CARE_PROVIDER_SITE_OTHER): Payer: Medicaid Other | Admitting: Women's Health

## 2020-02-10 ENCOUNTER — Encounter: Payer: Self-pay | Admitting: Women's Health

## 2020-02-10 ENCOUNTER — Telehealth: Payer: Self-pay

## 2020-02-10 DIAGNOSIS — Z5329 Procedure and treatment not carried out because of patient's decision for other reasons: Secondary | ICD-10-CM

## 2020-02-10 NOTE — Progress Notes (Signed)
Patient spoke with RN and interpreter, provider attempted to contact patient via video with video interpreter and sent text messages to both numbers on file, patient did not log on. RN attempted to call patient again x2, no answer. Patient will be rescheduled.  Clarisa Fling, NP  9:23 AM 02/10/2020

## 2020-02-10 NOTE — Patient Instructions (Signed)

## 2020-02-10 NOTE — Telephone Encounter (Signed)
Completed intake with patient and Arabic interpreter; however, pt did not respond to video visit. LVM for pt to c/b

## 2020-08-17 ENCOUNTER — Other Ambulatory Visit: Payer: Self-pay

## 2020-08-17 ENCOUNTER — Encounter: Payer: Self-pay | Admitting: Obstetrics

## 2020-08-17 ENCOUNTER — Ambulatory Visit (INDEPENDENT_AMBULATORY_CARE_PROVIDER_SITE_OTHER): Payer: Medicaid Other

## 2020-08-17 DIAGNOSIS — Z3492 Encounter for supervision of normal pregnancy, unspecified, second trimester: Secondary | ICD-10-CM

## 2020-08-17 DIAGNOSIS — Z3483 Encounter for supervision of other normal pregnancy, third trimester: Secondary | ICD-10-CM | POA: Insufficient documentation

## 2020-08-17 NOTE — Progress Notes (Signed)
PRENATAL INTAKE SUMMARY  Betty Crawford presents today New OB Nurse Interview.  OB History    Gravida  2   Para  1   Term  1   Preterm      AB      Living  1     SAB      TAB      Ectopic      Multiple  0   Live Births  1          I have reviewed the patient's medical, obstetrical, social, and family histories, medications, and available lab results.  SUBJECTIVE She has no unusual complaints. Patient states that her LMP was 04/19/20 which puts her at [redacted]w[redacted]d today. U/S deferred, and will await anatomy scan.  OBJECTIVE Initial Physical Exam (New OB)  GENERAL APPEARANCE: alert, well appearing   ASSESSMENT Normal pregnancy  PLAN Prenatal care to be competed at [place] labs should be completed at [place] visit Patient still has BP cuff from last pregnancy U/S not performed today due to being 102w1d and small child with her at visit Baby scripts not ordered due to Arabic language PHQ2 score: 0 GAD 7 score: 0

## 2020-08-18 NOTE — Progress Notes (Signed)
Patient was assessed and managed by nursing staff during this encounter. I have reviewed the chart and agree with the documentation and plan. I have also made any necessary editorial changes.  Verita Schneiders, MD 08/18/2020 8:39 AM

## 2020-08-31 ENCOUNTER — Other Ambulatory Visit (HOSPITAL_COMMUNITY)
Admission: RE | Admit: 2020-08-31 | Discharge: 2020-08-31 | Disposition: A | Discharge status: Home / Self Care | Payer: Medicaid Other | Source: Ambulatory Visit | Attending: Medical | Admitting: Medical

## 2020-08-31 ENCOUNTER — Ambulatory Visit (INDEPENDENT_AMBULATORY_CARE_PROVIDER_SITE_OTHER): Payer: Medicaid Other | Admitting: Medical

## 2020-08-31 ENCOUNTER — Other Ambulatory Visit: Payer: Self-pay

## 2020-08-31 ENCOUNTER — Encounter: Payer: Self-pay | Admitting: Medical

## 2020-08-31 VITALS — BP 111/65 | HR 75 | Wt 188.0 lb

## 2020-08-31 DIAGNOSIS — Z3482 Encounter for supervision of other normal pregnancy, second trimester: Secondary | ICD-10-CM | POA: Diagnosis not present

## 2020-08-31 DIAGNOSIS — Z603 Acculturation difficulty: Secondary | ICD-10-CM

## 2020-08-31 DIAGNOSIS — O0932 Supervision of pregnancy with insufficient antenatal care, second trimester: Secondary | ICD-10-CM

## 2020-08-31 DIAGNOSIS — Z3A19 19 weeks gestation of pregnancy: Secondary | ICD-10-CM

## 2020-08-31 DIAGNOSIS — O3412 Maternal care for benign tumor of corpus uteri, second trimester: Secondary | ICD-10-CM

## 2020-08-31 DIAGNOSIS — Z8759 Personal history of other complications of pregnancy, childbirth and the puerperium: Secondary | ICD-10-CM | POA: Insufficient documentation

## 2020-08-31 DIAGNOSIS — Z23 Encounter for immunization: Secondary | ICD-10-CM | POA: Diagnosis not present

## 2020-08-31 DIAGNOSIS — D259 Leiomyoma of uterus, unspecified: Secondary | ICD-10-CM

## 2020-08-31 NOTE — Patient Instructions (Addendum)
Safe Medications in Pregnancy   Acne: Benzoyl Peroxide Salicylic Acid  Backache/Headache: Tylenol: 2 regular strength every 4 hours OR              2 Extra strength every 6 hours  Colds/Coughs/Allergies: Benadryl (alcohol free) 25 mg every 6 hours as needed Breath right strips Claritin Cepacol throat lozenges Chloraseptic throat spray Cold-Eeze- up to three times per day Cough drops, alcohol free Flonase (by prescription only) Guaifenesin Mucinex Robitussin DM (plain only, alcohol free) Saline nasal spray/drops Sudafed (pseudoephedrine) & Actifed ** use only after [redacted] weeks gestation and if you do not have high blood pressure Tylenol Vicks Vaporub Zinc lozenges Zyrtec   Constipation: Colace Ducolax suppositories Fleet enema Glycerin suppositories Metamucil Milk of magnesia Miralax Senokot Smooth move tea  Diarrhea: Kaopectate Imodium A-D  *NO pepto Bismol  Hemorrhoids: Anusol Anusol HC Preparation H Tucks  Indigestion: Tums Maalox Mylanta Zantac  Pepcid  Insomnia: Benadryl (alcohol free) 25mg every 6 hours as needed Tylenol PM Unisom, no Gelcaps  Leg Cramps: Tums MagGel  Nausea/Vomiting:  Bonine Dramamine Emetrol Ginger extract Sea bands Meclizine  Nausea medication to take during pregnancy:  Unisom (doxylamine succinate 25 mg tablets) Take one tablet daily at bedtime. If symptoms are not adequately controlled, the dose can be increased to a maximum recommended dose of two tablets daily (1/2 tablet in the morning, 1/2 tablet mid-afternoon and one at bedtime). Vitamin B6 100mg tablets. Take one tablet twice a day (up to 200 mg per day).  Skin Rashes: Aveeno products Benadryl cream or 25mg every 6 hours as needed Calamine Lotion 1% cortisone cream  Yeast infection: Gyne-lotrimin 7 Monistat 7   **If taking multiple medications, please check labels to avoid duplicating the same active ingredients **take medication as directed on  the label ** Do not exceed 4000 mg of tylenol in 24 hours **Do not take medications that contain aspirin or ibuprofen   Childbirth Education Options: [facility] Classes:  Childbirth education classes can help you get ready for a positive parenting experience. You can also meet other expectant parents and get free stuff for your baby. Each class runs for five weeks on the same night and costs $45 for the mother-to-be and her support person. Medicaid covers the cost if you are eligible. Call [phone] to register. [facility] Childbirth Education:  [phone] or [phone] or [email or web address]  Baby & Me Class: Discuss newborn & infant parenting and family adjustment issues with other new mothers in a relaxed environment. Each week brings a new speaker or baby-centered activity. We encourage new mothers to join us every Thursday at 11:00am. Babies birth until crawling. No registration or fee. Daddy Boot Camp: This course offers Dads-to-be the tools and knowledge needed to feel confident on their journey to becoming new fathers. Experienced dads, who have been trained as coaches, teach dads-to-be how to hold, comfort, diaper, swaddle and play with their infant while being able to support the new mom as well. A class for men taught by men. $25/dad Big Brother/Big Sister: Let your children share in the joy of a new brother or sister in this special class designed just for them. Class includes discussion about how families care for babies: swaddling, holding, diapering, safety as well as how they can be helpful in their new role. This class is designed for children ages 2 to 6, but any age is welcome. Please register each child individually. $5/child  Mom Talk: This mom-led group offers support and connection to   mothers as they journey through the adjustments and struggles of that sometimes overwhelming first year after the birth of a child. Tuesdays at 10:00am and  Thursdays at 6:00pm. Babies welcome. No registration or fee. Breastfeeding Support Group: This group is a mother-to-mother support circle where moms have the opportunity to share their breastfeeding experiences. A Lactation Consultant is present for questions and concerns. Meets each Tuesday at 11:00am. No fee or registration. Breastfeeding Your Baby: Learn what to expect in the first days of breastfeeding your newborn.  This class will help you feel more confident with the skills needed to begin your breastfeeding experience. Many new mothers are concerned about breastfeeding after leaving the hospital. This class will also address the most common fears and challenges about breastfeeding during the first few weeks, months and beyond. (call for fee) Comfort Techniques and Tour: This 2 hour interactive class will provide you the opportunity to learn & practice hands-on techniques that can help relieve some of the discomfort of labor and encourage your baby to rotate toward the best position for birth. You and your partner will be able to try a variety of labor positions with birth balls and rebozos as well as practice breathing, relaxation, and visualization techniques. A tour of the [facility] is included with this class. $20 per registrant and support person Childbirth Class- Weekend Option: This class is a Weekend version of our Birth & Baby series. It is designed for parents who have a difficult time fitting several weeks of classes into their schedule. It covers the care of your newborn and the basics of labor and childbirth. It also includes a Maternity Care Center Tour of [facility] and lunch. The class is held two consecutive days: beginning on Friday evening from 6:30 - 8:30 p.m. and the next day, Saturday from 9 a.m. - 4 p.m. (call for fee) Waterbirth Class: Interested in a waterbirth?  This informational class will help you discover whether waterbirth is the right fit  for you. Education about waterbirth itself, supplies you would need and how to assemble your support team is what you can expect from this class. Some obstetrical practices require this class in order to pursue a waterbirth. (Not all obstetrical practices offer waterbirth-check with your healthcare provider.) Register only the expectant mom, but you are encouraged to bring your partner to class! Required if planning waterbirth, no fee. Infant/Child CPR: Parents, grandparents, babysitters, and friends learn Cardio-Pulmonary Resuscitation skills for infants and children. You will also learn how to treat both conscious and unconscious choking in infants and children. This Family & Friends program does not offer certification. Register each participant individually to ensure that enough mannequins are available. (Call for fee) Grandparent Love: Expecting a grandbaby? This class is for you! Learn about the latest infant care and safety recommendations and ways to support your own child as he or she transitions into the parenting role. Taught by Registered Nurses who are childbirth instructors, but most importantly...they are grandmothers too! $10/person. Childbirth Class- Natural Childbirth: This series of 5 weekly classes is for expectant parents who want to learn and practice natural methods of coping with the process of labor and childbirth. Relaxation, breathing, massage, visualization, role of the partner, and helpful positioning are highlighted. Participants learn how to be confident in their body's ability to give birth. This class will empower and help parents make informed decisions about their own care. Includes discussion that will help new parents transition into the immediate postpartum period. Maternity   who want to learn and practice natural methods of coping with the process of labor and childbirth. Relaxation, breathing, massage, visualization, role of the partner, and helpful positioning are highlighted. Participants learn how to be confident in their body's ability to give birth. This class will empower and help parents make informed decisions about their own care. Includes discussion that will help new parents transition into the immediate postpartum period. [facility] is included. We suggest taking this class between 25-32 weeks, but  it's only a recommendation. $75 per registrant and one support person or $30 Medicaid. Childbirth Class- 3 week Series: This option of 3 weekly classes helps you and your labor partner prepare for childbirth. Newborn care, labor & birth, cesarean birth, pain management, and comfort techniques are discussed and a [facility] is included. The class meets at the same time, on the same day of the week for 3 consecutive weeks beginning with the starting date you choose. $60 for registrant and one support person.  Marvelous Multiples: Expecting twins, triplets, or more? This class covers the differences in labor, birth, parenting, and breastfeeding issues that face multiples' parents. NICU tour is included. Led by a Certified Childbirth Educator who is the mother of twins. No fee. Caring for Baby: This class is for expectant and adoptive parents who want to learn and practice the most up-to-date newborn care for their babies. Focus is on birth through the first six weeks of life. Topics include feeding, bathing, diapering, crying, umbilical cord care, circumcision care and safe sleep. Parents learn to recognize symptoms of illness and when to call the pediatrician. Register only the mom-to-be and your partner or support person can plan to come with you! $10 per registrant and support person Childbirth Class- online option: This online class offers you the freedom to complete a Birth and Baby series in the comfort of your own home. The flexibility of this option allows you to review sections at your own pace, at times convenient to you and your support people. It includes additional video information, animations, quizzes, and extended activities. Get organized with helpful eClass tools, checklists, and trackers. Once you register online for the class, you will receive an email within a few days to accept the invitation and begin the class when the time is right for you. The content  will be available to you for 60 days. $60 for 60 days of online access for you and your support people.        AREA PEDIATRIC/FAMILY PRACTICE PHYSICIANS  Central/Southeast [place] [phone]) . [facility] Davy Pique, MD; Gwendlyn Deutscher, MD; Walker Kehr, MD; Andria Frames, MD; McDiarmid, MD; Dutch Quint, MD; Nori Riis, MD; Mingo Amber, [address] [place], [place] [address] o [phone] o Mon-Fri 8:30-12:30, 1:30-5:00 o Providers come to see babies at [facility] o Accepting Medicaid . [place] [facility] [place] providers who accept newborns: Dorthy Cooler, MD; Orland Mustard, MD; Stephanie Acre, MD o [place], [place], [place] [address] o [phone] o Mon-Fri 8:00-5:30 o Babies seen by providers at [facility] o Does NOT accept Medicaid o Please call early in hospitalization for appointment (limited availability)  . Mustard Beulah, MD o [address] [place], [place] [address] o [phone] o Mon, Tue, Thur, Fri 8:30-5:00, Wed 10:00-7:00 (closed 1-2pm) o Babies seen by [facility] providers o Accepting Medicaid . [place], MD o [address]  [address], [place], [place] [address] o [phone] o Mon-Fri 8:30-5:00, Sat 8:30-12:00 o Provider comes to see babies at [place] Medicaid o Must have been referred from current patients or contacted office prior to delivery . [facility] ([place] for [place]) Franne Forts, MD; Tamera Punt, MD; Doneen Poisson, MD; Fatima Sanger, MD; Wynetta Emery, MD; Jess Barters, MD; Tami Ribas, MD; Herbert Moors, MD; Derrell Lolling, MD; Dorothyann Peng, MD; Lucious Groves, NP; Baldo Ash, NP o [place], [place], [place] [address] o [phone] o Mon, Tue, Thur, Fri 8:30-5:30, Wed 9:30-5:30, Sat 8:30-12:30 o Babies seen by [facility] providers o Accepting Medicaid o Only accepting infants of first-time parents or siblings of  current patients [facility] discharge coordinator will make follow-up appointment . Baltazar Najjar o [place] [address], [place], [place]  [address] o [phone]   Fax - [phone] . [facility] o [address], [place], [place]  [address] o Phone - [phone]   Fax [phone] . [place], [place], [place], [place]  [address] o [phone]  [place] [phone]) . [facility] Reginal Lutes, MD; Jacklynn Ganong, MD; Torrie Mayers, MD; MD; Rosana Hoes, MD; Servando Salina, MD; Rose Fillers, MD; Rex Kras, MD; Corinna Capra, MD; Volney American, MD; Trilby Drummer, MD; Janann Colonel, MD; Jimmye Norman, [place], [place], [place] [address] o [phone] o Mon-Fri 8:30-5:00 (extended evenings Mon-Thur as needed), Sat-Sun 10:00-1:00 o Providers come to see babies at [place] Medicaid for families of first-time babies and families with all children in the household age 44 and under. Must register with office prior to making appointment (M-F only). . [place], NP; Tomi Bamberger, MD; Redmond School, MD; [place], [place] [address] [place], [place] [address] o [phone] o Mon-Fri 8:00-5:00 o Babies seen by providers at [facility] o Does NOT accept Medicaid/Commercial Insurance Only . [facility] [place] ([facility])  Marnee Guarneri, MD; Drema Dallas, MD; Montine Circle, MD; Vilma Prader, MD; Vanita Panda, MD; Alfonso Ramus, MD; Ruthann Cancer, MD; Roxanne Mins, MD; Rosalva Ferron, MD; Polly Cobia, MD o [address] [place], [place] [address] o [phone] o Mon-Fri 8:30-5:30, Sat (Oct.-Mar.) 9:00-1:00 o Babies seen by providers at [place] [phone]) . [facility] Peers, MD; Suzan Slick, MD o [address], [place], [place] [address] o [phone] o Mon-Fri 8:30-5:00, Sat 8:30-12:00 o Providers come to see babies at [facility] o Does NOT accept Medicaid . [place] at [place], [place]; Howe, MD; [place], [place]; Nancy Fetter, MD; Moreen Fowler, [place], [place], [place] [address] o [phone] o Mon-Fri 8:00-5:00 o Babies seen by providers at [facility] o Does NOT accept Medicaid o Only accepting babies of parents who are patients o Please call early in hospitalization for appointment (limited availability) . [facility] Pediatric[facility] Blanca Friend, MD; Sharlene Motts, MD; Rod Can, MD; Warner Mccreedy, NP; Sabra Heck, MD; Ermalinda Memos, MD; Sharlett Iles, NP; Aurther Loft, MD; Jerrye Beavers, MD; Marcello Moores, MD; Berline Lopes, MD; Charolette Forward, MD o [address] [place], [place], [place] [address] o [phone] o Mon-Fri 8:00-5:00, Sat 9:00-12:00 o Providers come to see babies at [facility] o Does NOT accept [facility] [phone]) . [place] [facility] [place] providers accepting new patients: Dayna Ramus, NP; Rolland Porter, [place], [place], [place] [address] o [phone] o Mon-Fri 8:00-5:00 o Babies seen by providers at [facility] o Does NOT accept Medicaid  o Only accepting babies of parents who are patients o Please call early in hospitalization for appointment (limited availability) . [facility] Oswaldo Conroy, MD; Sheran Lawless, MD o [address] [place], [place] [address] o [phone] (press 1 to schedule appointment) o Mon-Fri 8:00-5:00 o Providers come to see babies at [facility] o Does NOT accept Medicaid . [facility] Jodi Mourning, MD o [address], [place], [place] [address] o [phone] o Mon-Fri 8:30-5:00 (lunch 12:30-1:00), extended hours by appointment only Wed 5:00-6:30 o Babies seen by [facility] providers o Accepting Medicaid . [place] at Evalyn Casco, MD; [place], MD; Ethlyn Gallery, MD o [place], [place], [place] [address] o [phone] o Mon-Fri 8:00-5:00 o Babies seen by [facility] providers o Does NOT accept Medicaid . Therapist, music at  [place], MD; Yong Channel, MD; [place], [address] [place], [place] [address] o [phone] o Mon-Fri 8:00-5:00 o Babies seen by [facility] providers o Does NOT accept Medicaid . [place], [place]; [place], [place]; [place], NP; Albertina Parr, MD; Frederic Jericho, MD; Ronney Lion, MD; [place] Levering, NP; Jerelene Redden, NP; Tomasita Crumble, NP; Ronelle Nigh, NP; Corinna Lines, MD; [place], MD o [address] [place], [place] [address] o [phone] o Mon-Fri 8:30-5:00, Sat 10:00-1:00 o Providers come to see babies at [facility] o Does NOT accept Medicaid o Free prenatal information session Tuesdays at 4:45pm . [facility] Porfirio Oar, MD; [place], [place]; [place], [place]; [place], [address]., [place] [address] o [phone] o Mon-Fri 7:30-5:30 o Babies seen by [facility] providers . [facility] Children's Doctor o [address], [place], [place], [place]  [address] o [phone]   Fax - [phone]  [place] [phone] & [phone]) . [place], [place] [address] [place], [place] [address] o [phone] o Mon-Thur 8:00-6:00 o Providers come to see babies at [place] Medicaid . [place], NP; Melford Aase, MD; [place], [place]; [place], [place] [address]., [place], [place] [address] o [phone] o Mon-Thur 7:30-7:30, Fri 7:30-4:30 o Babies seen by [facility] providers o Accepting Medicaid . [facility] Nyra Jabs, MD; Cristino Martes, NP; Gertie Baron, MD o [place], [place], [place] [address] o [phone] o Mon-Fri 8:30-5:00, Sat 8:30-12:00 o Providers come to see babies at [place] Medicaid o Must have "Meet & Greet" appointment at office prior to delivery . [place] ([place]) Jodene Nam, MD; Juleen China, MD; Clydene Laming, [address], [place], [place]  [address] o [phone] o Mon-Wed 8:00-6:00, Thur-Fri 8:00-5:00, Sat 9:00-12:00 o Providers come to see babies at [facility] o Does NOT accept Medicaid o Only accepting siblings of current patients . [facility] [place], [place], [place], [place]  [address] o [phone]   Fax [phone] . [place] at [place] [address] [place], [place]  [address] o [phone]   Fax - [place] [phone] & [phone]) . Therapist, music at [place], DO; [place], [address] [place], [place] [address] o [phone] o Mon-Fri 7:00-5:00 o Babies seen by [facility] providers o Does NOT accept Medicaid . [place], MD; [place], [place]; [place], [place], [place], [place] [address] o [phone] o Mon-Fri 8:00-5:00 o  Babies seen by [facility] providers o Accepting Medicaid . [place], MD; [place], [place]; [place], NP; [place], [place], [place], [place] [address] o [phone] o Mon-Fri 8:00-5:00 o Babies seen by providers at [facility] [phone]) . [facility] [place], [place] o [address] [place], [place] [address] o [phone] o Mon-Fri 8:00-5:00 o Babies seen by [facility] providers o Does NOT accept Medicaid o Limited availability, please call early in hospitalization to schedule follow-up . [facility] Leilani Merl, PA; Maisie Fus, MD; [place], MD; [place], [place]; Jeannine Kitten, MD; [place], [place] [facility] [address], [place], [place] [address] o [phone] o Mon-Fri 8:30-5:00, Sat 9:00-12:00 o Babies seen by providers at [facility] o Accepting Medicaid o Please register online then schedule online or call  office o [email or web address] . [place] ([place] [facility] [place]) Kristian Covey, NP; Dwyane Dee, MD; Leonidas Romberg, PA o [address] [place], [place], [place] [address] o [phone] o Mon-Fri 8:00-5:00 o Babies seen by providers at [facility] o Accepting Medicaid . [place] ([facility] [employer]) Dairl Ponder, MD; Rayvon Char, NP; Melina Modena, MD o [address] [place], [place], [place] [address] o [phone] o Mon-Fri 8:00-5:30, Sat&Sun by appointment (phones open at 8:30) o Babies seen by [facility] providers o Accepting Medicaid o Must be a first-time baby or sibling of current patient . [place], [address], [place], [place]  [address] o [phone]   Fax - [phone]  [place] [phone] & [phone]) . [place], [place]; [place], [place]; Benjamine Mola, MD; [place], [place]; Harrell Lark, MD o [address] [place], [place] [address] o [phone] o Mon-Thur 8:00-7:00, Fri 8:00-5:00, Sat 8:00-12:00, Sun 9:00-12:00 o Babies seen by [facility] providers o Accepting Medicaid . [facility] at [facility], MD; Ruthann Cancer, MD; [facility], MD o [address], [place], [place] [address] o [phone] o Mon-Thur 8:00-5:00 o Babies seen by providers at [facility] o Accepting Medicaid . [facility] at [place], MD; Coe-Goins, MD; Amedeo Plenty, MD; Bobby Rumpf, MD; List, MD; Lavonia Drafts, MD; Ruthann Cancer, MD; Selinda Eon, MD; Audie Box, MD; Jim Like, MD; Christie Nottingham, MD; Hubbard Hartshorn, MD; Modena Nunnery, MD o [address] [place], [place] [address] o [phone] o Mon-Fri 8:00-5:30, Sat (Oct.-Mar.) 9:00-1:00 o Babies seen by providers at [facility] o Accepting Medicaid o Must fill out new patient packet, available online at [email or web address] . [place] ([facility]) Barnabas Lister, NP; Kenton Kingfisher, NP; Claiborne Bill[place], NP; Rolla Plate, MD; [place], [place]; [place] Rhine, MD; Tyron Russell, MD; Delia Chimes, NP o [address], [place], [place] [address] o [phone] o Mon-Thur 8:00-5:30, Fri 8:00-5:00 o Babies seen by providers at [place] [phone]) . [place], [place]; [place], MD; Dennard Schaumann, MD; [place], [place] o [address] [place], [place] [address] o [phone] o Mon-Fri 8:00-5:00 o Babies seen by providers at [place] [phone]) . [place] at [place], [place]; Olen Pel, MD; [place], [place], [place],  [address] o [phone]  o Mon-Fri 8:00-5:00 o Babies seen by providers at [facility] o Does NOT accept Medicaid o Limited appointment availability, please call early in hospitalization  . Therapist, music at [place], [place]; [place], [address], [place], [place] [address] o [phone] o Mon-Fri 8:00-5:00 o Babies seen by [facility] providers o Does NOT accept Medicaid . [facility] Su Grand, MD; Guy Sandifer, MD; [place], [place]; [place], [place] [address], [place], [place] [address] o [phone] o Mon-Fri 8:00-5:00 o After hours clinic [facility] [place], [place] [address]) [phone] Mon-Fri 5:00-8:00, Sat 12:00-6:00, Sun 10:00-4:00 o Babies seen by [facility] providers o Accepting Medicaid . [place] at [facility] o [address] [place], [place]  [address] o [phone]   Fax - [phone]  [place] [phone]) . Therapist, music at [facility], MD o [address] [place], [place], [place] [address] o [phone] o Mon-Fri 8:00-5:00 o Babies seen by [facility] providers o Does NOT accept Medicaid . [place] ([place] at [place]) Bing Neighbors, MD o [address] [place], [place],  [address] o [phone] o Mon-Thur 8:00-7:00, Fri 8:00-5:00, Sat 8:00-12:00 o Babies seen by providers at [facility] o Accepting Medicaid - but does not have vaccinations in office (must be received elsewhere) o Limited availability, please call early in hospitalization  [place] ([id number]) . [facility], [place] [address] [place] [address] o [phone]  Fax [phone]

## 2020-08-31 NOTE — Progress Notes (Signed)
   PRENATAL VISIT NOTE  Subjective:  Betty Crawford is a 24 y.o. G2P1001 at [redacted]w[redacted]d being seen today for her first prenatal visit for this pregnancy.  She is currently monitored for the following issues for this low-risk pregnancy and has Language barrier, cultural differences; Uterine fibroids affecting pregnancy; Encounter for supervision of normal pregnancy, unspecified, second trimester; History of vacuum extraction assisted delivery; and Late prenatal care affecting pregnancy in second trimester on their problem list.  Patient reports no complaints.  Contractions: Not present. Vag. Bleeding: None.  Movement: Absent. Denies leaking of fluid.   She is planning to breastfeed. Desires IUD for contraception.   The following portions of the patient's history were reviewed and updated as appropriate: allergies, current medications, past family history, past medical history, past social history, past surgical history and problem list.   Objective:   Vitals:   08/31/20 1309  BP: 111/65  Pulse: 75  Weight: 188 lb (85.3 kg)    Fetal Status: Fetal Heart Rate (bpm): 164   Movement: Absent     General:  Alert, oriented and cooperative. Patient is in no acute distress.  Skin: Skin is warm and dry. No rash noted.   Cardiovascular: Normal heart rate and rhythm noted  Respiratory: Normal respiratory effort, no problems with respiration noted. Clear to auscultation.   Abdomen: Soft, gravid, appropriate for gestational age. Normal bowel sounds. Non-tender. Pain/Pressure: Absent     Pelvic: Cervical exam performed Dilation: Closed Effacement (%): Thick    Non-tender, uterus appropriate for dates Breast: symmetric, small fibrocystic changes noted, no focal masses, no nipple bleeding or discharge  Extremities: Normal range of motion.  Edema: None  Mental Status: Normal mood and affect. Normal behavior. Normal judgment and thought content.   Assessment and Plan:  Pregnancy: G2P1001 at [redacted]w[redacted]d 1.  Encounter for supervision of other normal pregnancy in second trimester - AFP, Serum, Open Spina Bifida - Genetic Screening - Panorama and AFP; normal Horizon during last pregnancy - Culture, OB Urine - Cervicovaginal ancillary only( [place]) - CBC/D/Plt+RPR+Rh+ABO+Rub Ab... - Flu Vaccine QUAD 36+ mos IM (Fluarix, Quad PF) - [place] MFM OB DETAIL +14 WK; scheduled  - Normal pap smear 2020  2. 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 History of vacuum extraction assisted delivery  4. Language barrier, cultural differences - Interpreter used on iPad  5. [redacted] weeks gestation of pregnancy  6. Late prenatal care affecting pregnancy in second trimester - [place] MFM OB DETAIL +14 WK; Future  Preterm labor/second trimester warning symptoms and general obstetric precautions including but not limited to vaginal bleeding, contractions, leaking of fluid and fetal movement were reviewed in detail with the patient. Please refer to After Visit Summary for other counseling recommendations.   Discussed the normal visit cadence for prenatal care Discussed the nature of our practice with multiple providers including residents and students   Return in about 4 weeks (around 09/28/2020) for LOB, Virtual.  Future Appointments  Date Time Provider [place]  09/09/2020  8:00 AM [facility] NURSE [facility]  09/09/2020  8:15 AM [facility] US2 [facility]  09/28/2020 10:55 AM Lajean Manes, CNM [facility] None    Kerry Hough, PA-C

## 2020-08-31 NOTE — Progress Notes (Addendum)
Arabic Interpreter Amal [phone]  New OB, c/o allergies.  FLU Vaccine given in RD, tolerated well.

## 2020-09-01 LAB — CERVICOVAGINAL ANCILLARY ONLY
Bacterial Vaginitis (gardnerella): NEGATIVE
Candida Glabrata: NEGATIVE
Candida Vaginitis: NEGATIVE
Chlamydia: NEGATIVE
Comment: NEGATIVE
Comment: NEGATIVE
Comment: NEGATIVE
Comment: NEGATIVE
Comment: NEGATIVE
Comment: NORMAL
Neisseria Gonorrhea: NEGATIVE
Trichomonas: NEGATIVE

## 2020-09-02 LAB — CULTURE, OB URINE

## 2020-09-02 LAB — URINE CULTURE, OB REFLEX

## 2020-09-03 LAB — CBC/D/PLT+RPR+RH+ABO+RUB AB...
Antibody Screen: NEGATIVE
Basophils Absolute: 0 10*3/uL (ref 0.0–0.2)
Basos: 0 %
EOS (ABSOLUTE): 0.1 10*3/uL (ref 0.0–0.4)
Eos: 1 %
HCV Ab: <0.1 s/co ratio (ref 0.0–0.9)
HIV Screen 4th Generation wRfx: NONREACTIVE
Hematocrit: 43.6 % (ref 34.0–46.6)
Hemoglobin: 13.9 g/dL (ref 11.1–15.9)
Hepatitis B Surface Ag: NEGATIVE
Immature Grans (Abs): 0.1 10*3/uL (ref 0.0–0.1)
Immature Granulocytes: 1 %
Lymphocytes Absolute: 1.7 10*3/uL (ref 0.7–3.1)
Lymphs: 17 %
MCH: 24.1 pg — ABNORMAL LOW (ref 26.6–33.0)
MCHC: 31.9 g/dL (ref 31.5–35.7)
MCV: 76 fL — ABNORMAL LOW (ref 79–97)
Monocytes Absolute: 0.6 10*3/uL (ref 0.1–0.9)
Monocytes: 6 %
Neutrophils Absolute: 7.8 10*3/uL — ABNORMAL HIGH (ref 1.4–7.0)
Neutrophils: 75 %
Platelets: 158 10*3/uL (ref 150–450)
RBC: 5.76 x10E6/uL — ABNORMAL HIGH (ref 3.77–5.28)
RDW: 15.4 % (ref 11.7–15.4)
RPR Ser Ql: NONREACTIVE
Rh Factor: POSITIVE
Rubella Antibodies, IGG: 24.8 index (ref >0.99)
WBC: 10.3 10*3/uL (ref 3.4–10.8)

## 2020-09-03 LAB — AFP, SERUM, OPEN SPINA BIFIDA
AFP MoM: 0.7
AFP Value: 30.8 ng/mL
Gest. Age on Collection Date: 19.1 weeks
Maternal Age At EDD: 25.5 yr
OSBR Risk 1 IN: 10000
Test Results:: NEGATIVE
Weight: 188 [lb_av]

## 2020-09-03 LAB — HCV INTERPRETATION

## 2020-09-06 ENCOUNTER — Encounter: Payer: Self-pay | Admitting: Medical

## 2020-09-09 ENCOUNTER — Encounter: Payer: Self-pay | Admitting: *Deleted

## 2020-09-09 ENCOUNTER — Other Ambulatory Visit: Payer: Self-pay

## 2020-09-09 ENCOUNTER — Ambulatory Visit: Payer: Medicaid Other | Admitting: *Deleted

## 2020-09-09 ENCOUNTER — Other Ambulatory Visit: Payer: Self-pay | Admitting: *Deleted

## 2020-09-09 ENCOUNTER — Ambulatory Visit: Payer: Medicaid Other | Attending: Medical

## 2020-09-09 VITALS — BP 132/57 | HR 66

## 2020-09-09 DIAGNOSIS — Z3482 Encounter for supervision of other normal pregnancy, second trimester: Secondary | ICD-10-CM

## 2020-09-09 DIAGNOSIS — O0932 Supervision of pregnancy with insufficient antenatal care, second trimester: Secondary | ICD-10-CM

## 2020-09-09 DIAGNOSIS — Z362 Encounter for other antenatal screening follow-up: Secondary | ICD-10-CM

## 2020-09-09 DIAGNOSIS — D219 Benign neoplasm of connective and other soft tissue, unspecified: Secondary | ICD-10-CM | POA: Insufficient documentation

## 2020-09-28 ENCOUNTER — Telehealth (INDEPENDENT_AMBULATORY_CARE_PROVIDER_SITE_OTHER): Payer: Medicaid Other | Admitting: Certified Nurse Midwife

## 2020-09-28 ENCOUNTER — Encounter: Payer: Self-pay | Admitting: Certified Nurse Midwife

## 2020-09-28 DIAGNOSIS — Z603 Acculturation difficulty: Secondary | ICD-10-CM

## 2020-09-28 DIAGNOSIS — O0932 Supervision of pregnancy with insufficient antenatal care, second trimester: Secondary | ICD-10-CM

## 2020-09-28 DIAGNOSIS — Z3A21 21 weeks gestation of pregnancy: Secondary | ICD-10-CM

## 2020-09-28 DIAGNOSIS — Z3492 Encounter for supervision of normal pregnancy, unspecified, second trimester: Secondary | ICD-10-CM

## 2020-09-28 NOTE — Progress Notes (Signed)
OBSTETRICS PRENATAL VIRTUAL VISIT ENCOUNTER NOTE  Provider location: [facility] [place] [facility] [place]   I connected with Betty Crawford on 09/28/20 at 11:44 AM EST by Virtual Encounter at home and verified that I am speaking with the correct person using two identifiers.   I discussed the limitations, risks, security and privacy concerns of performing an evaluation and management service virtually and the availability of in person appointments. I also discussed with the patient that there may be a patient responsible charge related to this service. The patient expressed understanding and agreed to proceed. Subjective:  Betty Crawford is a 24 y.o. G2P1001 at [redacted]w[redacted]d being seen today for ongoing prenatal care.  She is currently monitored for the following issues for this low-risk pregnancy and has Language barrier, cultural differences; Uterine fibroids affecting pregnancy; Encounter for supervision of normal pregnancy, unspecified, second trimester; History of vacuum extraction assisted delivery; and Late prenatal care affecting pregnancy in second trimester on their problem list.  Patient reports no complaints.  Contractions: Not present. Vag. Bleeding: None.  Movement: Present. Denies any leaking of fluid.   The following portions of the patient's history were reviewed and updated as appropriate: allergies, current medications, past family history, past medical history, past social history, past surgical history and problem list.   Objective:  There were no vitals filed for this visit.  Fetal Status:     Movement: Present     General:  Alert, oriented and cooperative. Patient is in no acute distress.  Respiratory: Normal respiratory effort, no problems with respiration noted  Mental Status: Normal mood and affect. Normal behavior. Normal judgment and thought content.  Rest of physical exam deferred due to type of encounter  Imaging: [place] MFM OB DETAIL +14 WK  Result Date:  09/09/2020 ----------------------------------------------------------------------  OBSTETRICS REPORT                       (Signed Final 09/09/2020 10:30 am) ---------------------------------------------------------------------- Patient Info  ID #:       [id number]                          D.O.B.:  [birth year] (24 yrs)  Name:       Betty Crawford                  Visit Date: 09/09/2020 08:43 am ---------------------------------------------------------------------- Performed By  Attending:        Johnell Comings MD         Ref. Address:     [place],                                                             [place] [address]  Performed By:     Claudia Desanctis,      [facility]                                                             [facility]  Referred By:      Luvenia Redden                    PA ---------------------------------------------------------------------- Orders  #  Description                           Code        Ordered By  1  [place] MFM OB DETAIL +14 WK               76811.01    Kerry Hough ----------------------------------------------------------------------  #  Order #                     Accession #                Episode #  1  [id number]                   [id number]                 [id number] ---------------------------------------------------------------------- Indications  Late prenatal care, second trimester           O09.32  [redacted] weeks gestation of pregnancy                Z3A.19  Encounter for other antenatal screening        Z36.2  follow-up  Uterine fibroids affecting pregnancy in        O34.12, D25.9  second trimester, antepartum ---------------------------------------------------------------------- Fetal Evaluation  Num Of Fetuses:         1  Cardiac  Activity:       Observed  Presentation:           Cephalic  Placenta:               Posterior  P. Cord Insertion:      Visualized, central  Amniotic Fluid  AFI FV:      Within normal limits ---------------------------------------------------------------------- Biometry  BPD:      43.3  mm     G. Age:  19w 1d         49  %    CI:        74.36   %    70 - 86  FL/HC:      18.9   %    16.1 - 18.3  HC:      159.4  mm     G. Age:  18w 5d         26  %    HC/AC:      1.12        1.09 - 1.39  AC:      142.9  mm     G. Age:  19w 4d         63  %    FL/BPD:     69.5   %  FL:       30.1  mm     G. Age:  19w 2d         49  %    FL/AC:      21.1   %    20 - 24  HUM:      29.1  mm     G. Age:  19w 3d         9  %  CER:      20.1  mm     G. Age:  19w 3d         10  %  NFT:       3.4  mm  LV:        6.3  mm  CM:        5.8  mm  Est. FW:     290  gm    0 lb 10 oz      61  % ---------------------------------------------------------------------- OB History  Gravidity:    2         Term:   1  Living:       1 ---------------------------------------------------------------------- Gestational Age  LMP:           20w 3d        Date:  04/19/20                 EDD:   01/24/21  U/S Today:     19w 1d                                        EDD:   02/02/21  Best:          19w 1d     Det. By:  U/S (09/09/20)           EDD:   02/02/21 ---------------------------------------------------------------------- Anatomy  Cranium:               Appears normal         Aortic Arch:            Appears normal  Cavum:                 Appears normal         Ductal Arch:            Appears normal  Ventricles:            Appears normal         Diaphragm:              Appears normal  Choroid Plexus:        Appears normal         Stomach:  Appears normal, left                                                                        sided  Cerebellum:            Appears normal         Abdomen:                 Appears normal  Posterior Fossa:       Appears normal         Abdominal Wall:         Appears nml (cord                                                                        insert, abd wall)  Nuchal Fold:           Appears normal         Cord Vessels:           Appears normal (3                                                                        vessel cord)  Face:                  Appears normal         Kidneys:                Appear normal                         (orbits and profile)  Lips:                  Appears normal         Bladder:                Appears normal  Thoracic:              Appears normal         Spine:                  Appears normal  Heart:                 Appears normal         Upper Extremities:      Appears normal,                         (4CH, axis, and                                Hands subopt.  situs)  RVOT:                  Appears normal         Lower Extremities:      Appears normal  LVOT:                  Appears normal  Other:  Fetus appears to be a female. Nasal bone visualized. Heels/feet          visualized. Hands not well visualized. VC, 3VV and 3VTV visualized.          Technically difficult due to fetal position. ---------------------------------------------------------------------- Cervix Uterus Adnexa  Cervix  Length:              4  cm.  Normal appearance by transabdominal scan. ---------------------------------------------------------------------- Myomas  Site                     L(cm)      W(cm)      D(cm)       Location  Left Anterior LUS        2.89       2.49       3.43        Intramural ----------------------------------------------------------------------  Blood Flow                  RI       PI       Comments ---------------------------------------------------------------------- Comments  This patient was seen for a detailed fetal anatomy scan due  to a fibroid uterus and short interval pregnancy.  The patient  presented late for  prenatal care.  She denies any significant past medical history and denies  any problems in her current pregnancy.  She had a cell free DNA test earlier in her pregnancy which  indicated a low risk for trisomy 7, 15, and 13. A female fetus is  predicted.  Based on the fetal biometry measurements obtained today,  her EDC was changed to February 02, 2021.  There were no obvious fetal anomalies noted on today's  ultrasound exam.  The patient was informed that anomalies may be missed due  to technical limitations. If the fetus is in a suboptimal position  or maternal habitus is increased, visualization of the fetus in  the maternal uterus may be impaired.  A 2 to 3 cm left anterior fibroid was noted on today's exam.  The increased risk of fetal growth issues and maternal pain  issues associated with fibroids in pregnancy was discussed.  A follow-up exam was scheduled in 4 weeks to confirm her  dates.  All conversations were held with the patient today with the  help of an Arabic interpreter. ----------------------------------------------------------------------                   Johnell Comings, MD Electronically Signed Final Report   09/09/2020 10:30 am ----------------------------------------------------------------------   Assessment and Plan:  Pregnancy: G2P1001 at [redacted]w[redacted]d 1. Encounter for supervision of normal pregnancy in second trimester, unspecified gravidity - Routine prenatal care - Anticipatory guidance on upcoming appointments - Educated and discussed fetal movement during pregnancy and what to expect at this time  - patient reports taking BP this morning but does not know what it was, husband took it and he is not at home to notify reading.   2. Language barrier, cultural differences - Arabic interpreter used throughout visit  3. Late prenatal care affecting pregnancy in second trimester  4. [redacted]  weeks gestation of pregnancy - Educated and discussed GTT that will occur 26-28 week appointment     Preterm labor symptoms and general obstetric precautions including but not limited to vaginal bleeding, contractions, leaking of fluid and fetal movement were reviewed in detail with the patient. I discussed the assessment and treatment plan with the patient. The patient was provided an opportunity to ask questions and all were answered. The patient agreed with the plan and demonstrated an understanding of the instructions. The patient was advised to call back or seek an in-person office evaluation/go to [facility] at [facility] for any urgent or concerning symptoms. Please refer to After Visit Summary for other counseling recommendations.   I provided 9 minutes of face-to-face time during this encounter.  Return in about 4 weeks (around 10/26/2020) for [place], in [place] .  Future Appointments  Date Time Provider [place]  10/07/2020  9:45 AM [facility] NURSE [facility]  10/07/2020 10:00 AM [facility] US1 [facility]  10/26/2020 10:55 AM Gavin Pound, CNM [place] None    Lajean Manes, [place] for [employer], [place]

## 2020-09-28 NOTE — Progress Notes (Signed)
ARABIC INTERPRETER [phone] [place]  I connected with  Lourena Simmonds on 09/28/20 by a video enabled telemedicine application and verified that I am speaking with the correct person using two identifiers.  Patient is at Home Provider at [facility]   I discussed the limitations of evaluation and management by telemedicine. The patient expressed understanding and agreed to proceed.  Patient is unable to check BP because her husband does it for her and he is not at home now.

## 2020-10-07 ENCOUNTER — Other Ambulatory Visit: Payer: Self-pay

## 2020-10-07 ENCOUNTER — Ambulatory Visit: Payer: Medicaid Other | Attending: Obstetrics and Gynecology

## 2020-10-07 ENCOUNTER — Encounter: Payer: Self-pay | Admitting: *Deleted

## 2020-10-07 ENCOUNTER — Ambulatory Visit: Payer: Medicaid Other | Admitting: *Deleted

## 2020-10-07 ENCOUNTER — Other Ambulatory Visit: Payer: Self-pay | Admitting: *Deleted

## 2020-10-07 VITALS — BP 113/49 | HR 72

## 2020-10-07 DIAGNOSIS — O3412 Maternal care for benign tumor of corpus uteri, second trimester: Secondary | ICD-10-CM

## 2020-10-07 DIAGNOSIS — O0932 Supervision of pregnancy with insufficient antenatal care, second trimester: Secondary | ICD-10-CM

## 2020-10-07 DIAGNOSIS — D259 Leiomyoma of uterus, unspecified: Secondary | ICD-10-CM | POA: Diagnosis not present

## 2020-10-07 DIAGNOSIS — Z362 Encounter for other antenatal screening follow-up: Secondary | ICD-10-CM

## 2020-10-07 DIAGNOSIS — O0930 Supervision of pregnancy with insufficient antenatal care, unspecified trimester: Secondary | ICD-10-CM | POA: Diagnosis present

## 2020-10-07 DIAGNOSIS — Z3A23 23 weeks gestation of pregnancy: Secondary | ICD-10-CM

## 2020-10-26 ENCOUNTER — Other Ambulatory Visit: Payer: Self-pay

## 2020-10-26 ENCOUNTER — Ambulatory Visit (INDEPENDENT_AMBULATORY_CARE_PROVIDER_SITE_OTHER): Payer: Medicaid Other

## 2020-10-26 VITALS — BP 125/67 | HR 76 | Wt 198.6 lb

## 2020-10-26 DIAGNOSIS — Z3492 Encounter for supervision of normal pregnancy, unspecified, second trimester: Secondary | ICD-10-CM

## 2020-10-26 DIAGNOSIS — Z789 Other specified health status: Secondary | ICD-10-CM

## 2020-10-26 DIAGNOSIS — R12 Heartburn: Secondary | ICD-10-CM

## 2020-10-26 DIAGNOSIS — Z3A25 25 weeks gestation of pregnancy: Secondary | ICD-10-CM

## 2020-10-26 MED ORDER — FAMOTIDINE 20 MG PO TABS: 20.0000 mg | ORAL_TABLET | Freq: Every day | ORAL | 2 refills | Status: DC

## 2020-10-26 NOTE — Progress Notes (Signed)
LOW-RISK PREGNANCY OFFICE VISIT  Patient name: Betty Crawford MRN [id number]  Date of birth: [birth year] Chief Complaint:   Routine Prenatal Visit  Subjective:   [name] is a 24 y.o. G62P1001 female at [redacted]w[redacted]d with an Estimated Date of Delivery: 02/02/21 being seen today for ongoing management of a low-risk pregnancy aeb has Language barrier, cultural differences; Uterine fibroids affecting pregnancy; Encounter for supervision of normal pregnancy, unspecified, second trimester; History of vacuum extraction assisted delivery; and Late prenatal care affecting pregnancy in second trimester on their problem list.  Patient presents today with complaint of heartburn. She states she is taking "something like candy and the name is multivitamin." She goes on to show provider a picture of Antacids.  Patient endorses fetal movement and denies vaginal concerns including abnormal discharge, leaking of fluid, and bleeding.  Contractions: Not present. Vag. Bleeding: None.  Movement: Present.  Reviewed past medical,surgical, social, obstetrical and family history as well as problem list, medications and allergies.  Objective   Vitals:   10/26/20 1110  BP: 125/67  Pulse: 76  Weight: 198 lb 9.6 oz (90.1 kg)  Body mass index is 34.09 kg/m.  Total Weight Gain:24 lb 9.6 oz (11.2 kg)         Physical Examination:   General appearance: Well appearing, and in no distress  Mental status: Alert, oriented to person, place, and time  Skin: Warm & dry  Cardiovascular: Normal heart rate noted  Respiratory: Normal respiratory effort, no distress  Abdomen: Soft, gravid, nontender, AGA with Fundal height of Fundal Height: 27 cm  Pelvic: Cervical exam deferred           Extremities: Edema: None  Fetal Status: Fetal Heart Rate (bpm): 148  Movement: Present   No results found for this or any previous visit (from the past 24 hour(s)).  Assessment & Plan:  Low-risk pregnancy of a 24 y.o., G2P1001 at [redacted]w[redacted]d with  an Estimated Date of Delivery: 02/02/21   1. Encounter for supervision of normal pregnancy in second trimester, unspecified gravidity -Anticipatory guidance for upcoming appts. -Reviewed Glucola appt preparation including fasting the night before and morning of.   -Discussed anticipated office time of 2.5-3 hours.  -Reviewed blood draw procedures and labs which also include check of iron level.  -Discussed how results of GTT are handled including diabetic education and BS testing for abnormal results and routine care for normal results.   2. [redacted] weeks gestation of pregnancy -Doing well overall. -Discussed change of EDD from when seen at [facility].   3. Language barrier -Interpretations completed with assistance of in person [facility] Interpreter: Hanan.  4. Heartburn -Pepcid prescription sent.  -Instructed to start with daily dosing and increase to twice daily if needed.      Meds:  Meds ordered this encounter  Medications  . famotidine (PEPCID) 20 MG tablet    Sig: Take 1 tablet (20 mg total) by mouth daily. Can increase to twice daily if needed.    Dispense:  60 tablet    Refill:  2    Order Specific Question:   Supervising Provider    Answer:   Donnamae Jude [[id number]]   Labs/procedures today:  Lab Orders  No laboratory test(s) ordered today     Reviewed: Preterm labor symptoms and general obstetric precautions including but not limited to vaginal bleeding, contractions, leaking of fluid and fetal movement were reviewed in detail with the patient.  All questions were answered.  Follow-up: Return in about 3 weeks (around  11/16/2020) for LROB with GTT.  No orders of the defined types were placed in this encounter.  Maryann Conners MSN, CNM 10/26/2020

## 2020-10-26 NOTE — Progress Notes (Signed)
Patient reports fetal movement, denies pain but complains of heartburn and would like rx sent.

## 2020-11-06 NOTE — L&D Delivery Note (Signed)
OB/GYN Faculty Practice Delivery Note  Betty Crawford is a 25 y.o. G2P1001 s/p vaginal delivery at [redacted]w[redacted]d. She was admitted for vaginal bleeding and decreased fetal movement in the setting of non-reactive NST.  ROM: 0h 70m with thick meconium stained fluid GBS Status: negative Maximum Maternal Temperature: 99.3F  Labor Progress: Pt was admitted given concern of vaginal bleeding and decreased fetal movement. Her NST was initially non-reactive, but Category 1 strip on arrival to L&D. Given her initial cervical exam of 2/80/-2, she was started on pitocin. She progressed well and was noted to have complete cervical dilation at 2052 on 01/31/21, shortly after AROM for thick meconium stained fluid. She then had an uncomplicated delivery as noted below.  Delivery Date/Time: 01/31/21 at 2055 Delivery: Called to room and patient was complete and pushing. Head delivered LOA. Loose nuchal cord present x1, reduced prior to delivery. Shoulder and body delivered in usual fashion. Infant with spontaneous cry, placed on mother's abdomen, dried and stimulated. Cord clamped x 2 after 1-minute delay, and cut by FOB under my direct supervision. Cord blood drawn. Placenta delivered spontaneously with gentle cord traction. Fundus firm with massage and Pitocin. Labia, perineum, vagina, and cervix were inspected, without evidence of lacerations. Post-placental Paraguard IUD was placed s/p lower uterine sweep as noted below.  Placenta: intact, 3-vessel cord, sent to L&D Complications: none Lacerations: none EBL: 50 ml Analgesia: epidural   Infant: viable female  APGARs 8 & 9  weight 2580g   Post-Placental IUD Insertion Procedure Note  Patient identified, informed consent signed prior to delivery, signed copy in chart, time out was performed.    Vaginal, labial and perineal areas thoroughly inspected for lacerations. No evidence of lacerations on exam.   - Paraguard IUD grasped between sterile gloved fingers.  Sterile lubrication applied to sterile gloved hand for ease of insertion. Fundus identified through abdominal wall using non-insertion hand. IUD inserted to fundus with bimanual technique. IUD carefully released at the fundus and insertion hand gently removed from vagina. Patient tolerated procedure well.  Patient given post procedure instructions and IUD care card with expiration date.  Patient is asked to keep IUD strings tucked in her vagina until her postpartum follow up visit in 4-6 weeks. Patient advised to abstain from sexual intercourse and pulling on strings before her follow-up visit. Patient verbalized an understanding of the plan of care and agrees.   Randa Ngo, MD OB Fellow, Faculty Practice 01/31/2021 9:57 PM

## 2020-11-16 ENCOUNTER — Other Ambulatory Visit: Payer: Self-pay

## 2020-11-16 ENCOUNTER — Ambulatory Visit (INDEPENDENT_AMBULATORY_CARE_PROVIDER_SITE_OTHER): Payer: Medicaid Other | Admitting: Advanced Practice Midwife

## 2020-11-16 VITALS — BP 121/67 | HR 80 | Wt 202.0 lb

## 2020-11-16 DIAGNOSIS — Z3A28 28 weeks gestation of pregnancy: Secondary | ICD-10-CM

## 2020-11-16 DIAGNOSIS — Z3492 Encounter for supervision of normal pregnancy, unspecified, second trimester: Secondary | ICD-10-CM

## 2020-11-16 DIAGNOSIS — O368390 Maternal care for abnormalities of the fetal heart rate or rhythm, unspecified trimester, not applicable or unspecified: Secondary | ICD-10-CM

## 2020-11-16 NOTE — Progress Notes (Signed)
   PRENATAL VISIT NOTE  Subjective:  Betty Crawford is a 25 y.o. G2P1001 at [redacted]w[redacted]d being seen today for ongoing prenatal care.  She is currently monitored for the following issues for this low-risk pregnancy and has Language barrier, cultural differences; Uterine fibroids affecting pregnancy; Encounter for supervision of normal pregnancy, unspecified, second trimester; History of vacuum extraction assisted delivery; and Late prenatal care affecting pregnancy in second trimester on their problem list.  Patient reports no complaints.  Contractions: Not present. Vag. Bleeding: None.  Movement: Present. Denies leaking of fluid.   The following portions of the patient's history were reviewed and updated as appropriate: allergies, current medications, past family history, past medical history, past social history, past surgical history and problem list.   Objective:   Vitals:   11/16/20 0834  BP: 121/67  Pulse: 80  Weight: 202 lb (91.6 kg)    Fetal Status: Fetal Heart Rate (bpm): NST-R Fundal Height: 28 cm Movement: Present     General:  Alert, oriented and cooperative. Patient is in no acute distress.  Skin: Skin is warm and dry. No rash noted.   Cardiovascular: Normal heart rate noted  Respiratory: Normal respiratory effort, no problems with respiration noted  Abdomen: Soft, gravid, appropriate for gestational age.  Pain/Pressure: Absent     Pelvic: Cervical exam deferred        Extremities: Normal range of motion.  Edema: None  Mental Status: Normal mood and affect. Normal behavior. Normal judgment and thought content.   Assessment and Plan:  Pregnancy: G2P1001 at [redacted]w[redacted]d 1. Encounter for supervision of normal pregnancy in second trimester, unspecified gravidity --Anticipatory guidance about next visits/weeks of pregnancy given. --next visit in 2 weeks in office  - Glucose Tolerance, 2 Hours w/1 Hour - CBC - HIV antibody (with reflex) - RPR  2. Abnormality in fetal heart  rate/rhythm, antepartum --Irregular FHR audible with doppler, NST done and appropriate for gestational age with 10 x 10 accels, no decels. No arrythmia audible with continuous monitor.   --Pt feeling good fetal movement, kick counts and reasons to seek care reviewed --Reassurance provided that testing today is normal  3. [redacted] weeks gestation of pregnancy   Preterm labor symptoms and general obstetric precautions including but not limited to vaginal bleeding, contractions, leaking of fluid and fetal movement were reviewed in detail with the patient. Please refer to After Visit Summary for other counseling recommendations.   Return in about 2 weeks (around 11/30/2020).  Future Appointments  Date Time Provider [place]  12/09/2020  9:45 AM [facility] NURSE [facility]  12/09/2020 10:00 AM [facility] US1 [facility] [place]    Fatima Blank, CNM

## 2020-11-16 NOTE — Progress Notes (Signed)
ROB/GTT.  Declined TDAP vaccine. Reports no problems today.

## 2020-11-16 NOTE — Patient Instructions (Signed)
28   40.       7  9.       ()  .          20 .   6-10 .                 .          .        Marland Kitchen   25-35  (11-16 )  .          28-40  ( 13-18 ).          15-25  ( 7-11 ).            () .       .     ()  .       .       .     .             .         .       ().    Marland Kitchen            .        ()  .              .           .       .         .      ()    .        .      ""      .         .             .     :                 .      .          600      .   1.    : o    .   . o        . o    . 2.        .        . 3.  4  5     3     . 4.           : o         . o    .       . o      .      .       .           .     .                     ( ).               .     1.             ()           . 2.     ( )       .      . 3.         . 4.        : o       .  o    15  3-4   .     Marland Kitchen         .           .          .             .      :       .         .       .      .    .        .           .                     .       .     .            .          .    .                .          .     .  .          : americanpregnancy.org      : www.acog.org    : KeywordPortfolios.com.br     Third Trimester of Pregnancy  The third trimester of pregnancy is from week 28 through week 42. This is also called months 7 through 9. This trimester is when your unborn baby (fetus) is growing very fast. At the end of the ninth month, the unborn baby is about 20 inches long. It weighs about 6-10 pounds. Body changes during your third trimester Your body continues to go through many changes during this time. The changes vary and generally return to normal after the baby is born. Physical changes  Your weight will continue to increase. You may gain 25-35 pounds (11-16 kg) by the end of the pregnancy. If you are underweight, you may gain 28-40 lb (about 13-18 kg). If you are overweight, you may gain 15-25 lb (about 7-11 kg).  You may start to get stretch marks on your hips, belly (abdomen), and breasts.  Your breasts will continue to grow and may hurt. A yellow fluid (colostrum) may leak from your breasts. This is the first milk you are making for your baby.  You may have changes in your hair.  Your belly button may stick out.  You may have more swelling in your hands, face, or ankles. Health changes  You may have heartburn.  You may have trouble pooping (constipation).  You may get hemorrhoids. These are swollen veins in  the butt that can itch or get painful.  You may have swollen veins (varicose veins) in your legs.  You may have more body aches in the pelvis, back, or thighs.  You may have more tingling or numbness in your hands, arms, and legs. The skin on your belly may also feel numb.  You may feel short of breath as your womb (uterus) gets bigger. Other changes  You may pee (urinate) more often.  You may have more problems sleeping.  You may notice the unborn baby "dropping," or moving lower in your belly.  You may have more discharge coming from your vagina.  Your joints may feel loose, and you may have pain around your pelvic bone. Follow these instructions at home: Medicines  Take over-the-counter and prescription medicines  only as told by your doctor. Some medicines are not safe during pregnancy.  Take a prenatal vitamin that contains at least 600 micrograms (mcg) of folic acid. Eating and drinking  Eat healthy meals that include: ? Fresh fruits and vegetables. ? Whole grains. ? Good sources of protein, such as meat, eggs, or tofu. ? Low-fat dairy products.  Avoid raw meat and unpasteurized juice, milk, and cheese. These carry germs that can harm you and your baby.  Eat 4 or 5 small meals rather than 3 large meals a day.  You may need to take these actions to prevent or treat trouble pooping: ? Drink enough fluids to keep your pee (urine) pale yellow. ? Eat foods that are high in fiber. These include beans, whole grains, and fresh fruits and vegetables. ? Limit foods that are high in fat and sugar. These include fried or sweet foods. Activity  Exercise only as told by your doctor. Stop exercising if you start to have cramps in your womb.  Avoid heavy lifting.  Do not exercise if it is too hot or too humid, or if you are in a place of great height (high altitude).  If you choose to, you may have sex unless your doctor tells you not to. Relieving pain and discomfort  Take  breaks often, and rest with your legs raised (elevated) if you have leg cramps or low back pain.  Take warm water baths (sitz baths) to soothe pain or discomfort caused by hemorrhoids. Use hemorrhoid cream if your doctor approves.  Wear a good support bra if your breasts are tender.  If you develop bulging, swollen veins in your legs: ? Wear support hose as told by your doctor. ? Raise your feet for 15 minutes, 3-4 times a day. ? Limit salt in your food. Safety  Talk to your doctor before traveling far distances.  Do not use hot tubs, steam rooms, or saunas.  Wear your seat belt at all times when you are in a car.  Talk with your doctor if someone is hurting you or yelling at you a lot. Preparing for your baby's arrival To prepare for the arrival of your baby:  Take prenatal classes.  Visit the hospital and tour the maternity area.  Buy a rear-facing car seat. Learn how to install it in your car.  Prepare the baby's room. Take out all pillows and stuffed animals from the baby's crib. General instructions  Avoid cat litter boxes and soil used by cats. These carry germs that can cause harm to the baby and can cause a loss of your baby by miscarriage or stillbirth.  Do not douche or use tampons. Do not use scented sanitary pads.  Do not smoke or use any products that contain nicotine or tobacco. If you need help quitting, ask your doctor.  Do not drink alcohol.  Do not use herbal medicines, illegal drugs, or medicines that were not approved by your doctor. Chemicals in these products can affect your baby.  Keep all follow-up visits. This is important. Where to find more information  American Pregnancy Association: americanpregnancy.org  [employer]: www.acog.org  Office on Women's Health: KeywordPortfolios.com.br Contact a doctor if:  You have a fever.  You have mild cramps or pressure in your lower belly.  You have a  nagging pain in your belly area.  You vomit, or you have watery poop (diarrhea).  You have bad-smelling fluid coming from your vagina.  You have pain  when you pee, or your pee smells bad.  You have a headache that does not go away when you take medicine.  You have changes in how you see, or you see spots in front of your eyes. Get help right away if:  Your water breaks.  You have regular contractions that are less than 5 minutes apart.  You are spotting or bleeding from your vagina.  You have very bad belly cramps or pain.  You have trouble breathing.  You have chest pain.  You faint.  You have not felt the baby move for the amount of time told by your doctor.  You have new or increased pain, swelling, or redness in an arm or leg. Summary  The third trimester is from week 28 through week 40 (months 7 through 9). This is the time when your unborn baby is growing very fast.  During this time, your discomfort may increase as you gain weight and as your baby grows.  Get ready for your baby to arrive by taking prenatal classes, buying a rear-facing car seat, and preparing the baby's room.  Get help right away if you are bleeding from your vagina, you have chest pain and trouble breathing, or you have not felt the baby move for the amount of time told by your doctor. This information is not intended to replace advice given to you by your health care provider. Make sure you discuss any questions you have with your health care provider. Document Revised: 03/31/2020 Document Reviewed: 02/05/2020 Elsevier Patient Education  2021 [employer].

## 2020-11-17 LAB — CBC
Hematocrit: 39.5 % (ref 34.0–46.6)
Hemoglobin: 13.1 g/dL (ref 11.1–15.9)
MCH: 25.6 pg — ABNORMAL LOW (ref 26.6–33.0)
MCHC: 33.2 g/dL (ref 31.5–35.7)
MCV: 77 fL — ABNORMAL LOW (ref 79–97)
Platelets: 146 10*3/uL — ABNORMAL LOW (ref 150–450)
RBC: 5.11 x10E6/uL (ref 3.77–5.28)
RDW: 15.3 % (ref 11.7–15.4)
WBC: 9.4 10*3/uL (ref 3.4–10.8)

## 2020-11-17 LAB — GLUCOSE TOLERANCE, 2 HOURS W/ 1HR
Glucose, 1 hour: 167 mg/dL (ref 65–179)
Glucose, 2 hour: 131 mg/dL (ref 65–152)
Glucose, Fasting: 83 mg/dL (ref 65–91)

## 2020-11-17 LAB — RPR: RPR Ser Ql: NONREACTIVE

## 2020-11-17 LAB — HIV ANTIBODY (ROUTINE TESTING W REFLEX): HIV Screen 4th Generation wRfx: NONREACTIVE

## 2020-11-30 ENCOUNTER — Other Ambulatory Visit: Payer: Self-pay

## 2020-11-30 ENCOUNTER — Ambulatory Visit (INDEPENDENT_AMBULATORY_CARE_PROVIDER_SITE_OTHER): Payer: Medicaid Other | Admitting: Advanced Practice Midwife

## 2020-11-30 VITALS — BP 117/62 | HR 73 | Wt 205.0 lb

## 2020-11-30 DIAGNOSIS — Z789 Other specified health status: Secondary | ICD-10-CM

## 2020-11-30 DIAGNOSIS — Z3492 Encounter for supervision of normal pregnancy, unspecified, second trimester: Secondary | ICD-10-CM

## 2020-11-30 DIAGNOSIS — Z3A30 30 weeks gestation of pregnancy: Secondary | ICD-10-CM

## 2020-11-30 NOTE — Progress Notes (Signed)
ROB [redacted]w[redacted]d Pt has not been made aware of GTT results.   CC: None

## 2020-11-30 NOTE — Progress Notes (Addendum)
   PRENATAL VISIT NOTE  Subjective:  Betty Crawford is a 25 y.o. G2P1001 at [redacted]w[redacted]d being seen today for ongoing prenatal care.  She is currently monitored for the following issues for this low-risk pregnancy and has Language barrier, cultural differences; Uterine fibroids affecting pregnancy; Encounter for supervision of normal pregnancy, unspecified, second trimester; History of vacuum extraction assisted delivery; and Late prenatal care affecting pregnancy in second trimester on their problem list.  Patient reports no complaints.  Contractions: Not present. Vag. Bleeding: None.  Movement: Present. Denies leaking of fluid.   The following portions of the patient's history were reviewed and updated as appropriate: allergies, current medications, past family history, past medical history, past social history, past surgical history and problem list.   Objective:   Vitals:   11/30/20 0933  BP: 117/62  Pulse: 73  Weight: 205 lb (93 kg)    Fetal Status: Fetal Heart Rate (bpm): 134 Fundal Height: 30 cm Movement: Present     General:  Alert, oriented and cooperative. Patient is in no acute distress.  Skin: Skin is warm and dry. No rash noted.   Cardiovascular: Normal heart rate noted  Respiratory: Normal respiratory effort, no problems with respiration noted  Abdomen: Soft, gravid, appropriate for gestational age.  Pain/Pressure: Absent     Pelvic: Cervical exam deferred        Extremities: Normal range of motion.  Edema: None  Mental Status: Normal mood and affect. Normal behavior. Normal judgment and thought content.   Assessment and Plan:  Pregnancy: G2P1001 at [redacted]w[redacted]d 1. [redacted] weeks gestation of pregnancy   2. Encounter for supervision of normal pregnancy in second trimester, unspecified gravidity --Anticipatory guidance about next visits/weeks of pregnancy given. --Next visit in 2 weeks  3. Language barrier --Arabic interpreter used for all communication  Preterm labor symptoms and  general obstetric precautions including but not limited to vaginal bleeding, contractions, leaking of fluid and fetal movement were reviewed in detail with the patient. Please refer to After Visit Summary for other counseling recommendations.   Return in about 2 weeks (around 12/14/2020).  Future Appointments  Date Time Provider [place]  12/09/2020  9:45 AM [facility] NURSE [facility]  12/09/2020 10:00 AM [facility] US1 [facility]  12/14/2020  2:00 PM Griffin Basil, MD [place] None    Fatima Blank, CNM

## 2020-11-30 NOTE — Patient Instructions (Signed)

## 2020-12-09 ENCOUNTER — Other Ambulatory Visit: Payer: Self-pay

## 2020-12-09 ENCOUNTER — Ambulatory Visit: Payer: Medicaid Other | Admitting: *Deleted

## 2020-12-09 ENCOUNTER — Encounter: Payer: Self-pay | Admitting: *Deleted

## 2020-12-09 ENCOUNTER — Ambulatory Visit: Payer: Medicaid Other | Attending: Obstetrics and Gynecology

## 2020-12-09 VITALS — BP 109/51 | HR 75

## 2020-12-09 DIAGNOSIS — D259 Leiomyoma of uterus, unspecified: Secondary | ICD-10-CM

## 2020-12-09 DIAGNOSIS — Z362 Encounter for other antenatal screening follow-up: Secondary | ICD-10-CM

## 2020-12-09 DIAGNOSIS — O3413 Maternal care for benign tumor of corpus uteri, third trimester: Secondary | ICD-10-CM

## 2020-12-09 DIAGNOSIS — Z3A32 32 weeks gestation of pregnancy: Secondary | ICD-10-CM

## 2020-12-09 DIAGNOSIS — O0933 Supervision of pregnancy with insufficient antenatal care, third trimester: Secondary | ICD-10-CM | POA: Diagnosis not present

## 2020-12-09 DIAGNOSIS — O0930 Supervision of pregnancy with insufficient antenatal care, unspecified trimester: Secondary | ICD-10-CM | POA: Diagnosis present

## 2020-12-14 ENCOUNTER — Ambulatory Visit (INDEPENDENT_AMBULATORY_CARE_PROVIDER_SITE_OTHER): Payer: Medicaid Other | Admitting: Obstetrics and Gynecology

## 2020-12-14 ENCOUNTER — Other Ambulatory Visit: Payer: Self-pay

## 2020-12-14 VITALS — BP 117/67 | HR 77 | Wt 203.0 lb

## 2020-12-14 DIAGNOSIS — Z603 Acculturation difficulty: Secondary | ICD-10-CM

## 2020-12-14 DIAGNOSIS — Z8759 Personal history of other complications of pregnancy, childbirth and the puerperium: Secondary | ICD-10-CM

## 2020-12-14 DIAGNOSIS — O3413 Maternal care for benign tumor of corpus uteri, third trimester: Secondary | ICD-10-CM

## 2020-12-14 DIAGNOSIS — D259 Leiomyoma of uterus, unspecified: Secondary | ICD-10-CM

## 2020-12-14 DIAGNOSIS — Z3A32 32 weeks gestation of pregnancy: Secondary | ICD-10-CM | POA: Insufficient documentation

## 2020-12-14 NOTE — Progress Notes (Signed)
   PRENATAL VISIT NOTE  Subjective:  Betty Crawford is a 25 y.o. G2P1001 at [redacted]w[redacted]d being seen today for ongoing prenatal care.  She is currently monitored for the following issues for this low-risk pregnancy and has Language barrier, cultural differences; Uterine fibroids affecting pregnancy; Encounter for supervision of normal pregnancy in multigravida in third trimester; History of vacuum extraction assisted delivery; Late prenatal care affecting pregnancy in second trimester; and [redacted] weeks gestation of pregnancy on their problem list.  Patient doing well with no acute concerns today. She reports no complaints.  Contractions: Not present. Vag. Bleeding: None.  Movement: Present. Denies leaking of fluid.   The following portions of the patient's history were reviewed and updated as appropriate: allergies, current medications, past family history, past medical history, past social history, past surgical history and problem list. Problem list updated.  Objective:   Vitals:   12/14/20 1355  BP: 117/67  Pulse: 77  Weight: 203 lb (92.1 kg)    Fetal Status: Fetal Heart Rate (bpm): 145 Fundal Height: 32 cm Movement: Present     General:  Alert, oriented and cooperative. Patient is in no acute distress.  Skin: Skin is warm and dry. No rash noted.   Cardiovascular: Normal heart rate noted  Respiratory: Normal respiratory effort, no problems with respiration noted  Abdomen: Soft, gravid, appropriate for gestational age.  Pain/Pressure: Absent     Pelvic: Cervical exam deferred        Extremities: Normal range of motion.  Edema: None  Mental Status:  Normal mood and affect. Normal behavior. Normal judgment and thought content.   Assessment and Plan:  Pregnancy: G2P1001 at [redacted]w[redacted]d  1. [redacted] weeks gestation of pregnancy   2. Leiomyoma of uterus affecting pregnancy in third trimester Recent u/s on 2/3 shows normal growth, no further scans recommended at this time  3. Language barrier, cultural  differences Interpreter present  4. History of vacuum extraction assisted delivery   Preterm labor symptoms and general obstetric precautions including but not limited to vaginal bleeding, contractions, leaking of fluid and fetal movement were reviewed in detail with the patient.  Please refer to After Visit Summary for other counseling recommendations.   Return in about 2 weeks (around 12/28/2020) for ROB, in person.   Lynnda Shields, MD

## 2020-12-28 ENCOUNTER — Encounter: Payer: Medicaid Other | Admitting: Advanced Practice Midwife

## 2020-12-29 ENCOUNTER — Other Ambulatory Visit: Payer: Self-pay

## 2020-12-29 ENCOUNTER — Encounter: Payer: Self-pay | Admitting: Obstetrics and Gynecology

## 2020-12-29 ENCOUNTER — Ambulatory Visit (INDEPENDENT_AMBULATORY_CARE_PROVIDER_SITE_OTHER): Payer: Medicaid Other | Admitting: Obstetrics and Gynecology

## 2020-12-29 VITALS — BP 127/73 | HR 76 | Wt 208.2 lb

## 2020-12-29 DIAGNOSIS — D259 Leiomyoma of uterus, unspecified: Secondary | ICD-10-CM

## 2020-12-29 DIAGNOSIS — O3413 Maternal care for benign tumor of corpus uteri, third trimester: Secondary | ICD-10-CM

## 2020-12-29 DIAGNOSIS — Z3A35 35 weeks gestation of pregnancy: Secondary | ICD-10-CM

## 2020-12-29 DIAGNOSIS — Z603 Acculturation difficulty: Secondary | ICD-10-CM

## 2020-12-29 DIAGNOSIS — Z8759 Personal history of other complications of pregnancy, childbirth and the puerperium: Secondary | ICD-10-CM

## 2020-12-29 DIAGNOSIS — Z3483 Encounter for supervision of other normal pregnancy, third trimester: Secondary | ICD-10-CM

## 2020-12-29 NOTE — Progress Notes (Signed)
   PRENATAL VISIT NOTE  Subjective:  Betty Crawford is a 25 y.o. G2P1001 at [redacted]w[redacted]d being seen today for ongoing prenatal care.  She is currently monitored for the following issues for this low-risk pregnancy and has Language barrier, cultural differences; Uterine fibroids affecting pregnancy; Encounter for supervision of normal pregnancy in multigravida in third trimester; History of vacuum extraction assisted delivery; Late prenatal care affecting pregnancy in second trimester; and [redacted] weeks gestation of pregnancy on their problem list.  Patient reports no complaints.  Contractions: Not present. Vag. Bleeding: None.  Movement: Present. Denies leaking of fluid.   The following portions of the patient's history were reviewed and updated as appropriate: allergies, current medications, past family history, past medical history, past social history, past surgical history and problem list.   Objective:   Vitals:   12/29/20 0917  BP: 127/73  Pulse: 76  Weight: 208 lb 3.2 oz (94.4 kg)    Fetal Status: Fetal Heart Rate (bpm): 131   Movement: Present     General:  Alert, oriented and cooperative. Patient is in no acute distress.  Skin: Skin is warm and dry. No rash noted.   Cardiovascular: Normal heart rate noted  Respiratory: Normal respiratory effort, no problems with respiration noted  Abdomen: Soft, gravid, appropriate for gestational age.  Pain/Pressure: Absent     Pelvic: Cervical exam deferred        Extremities: Normal range of motion.  Edema: None  Mental Status: Normal mood and affect. Normal behavior. Normal judgment and thought content.   Assessment and Plan:  Pregnancy: G2P1001 at [redacted]w[redacted]d  1. Encounter for supervision of normal pregnancy in multigravida in third trimester Desires post partum IUD, counseled about risks/benefits  2. History of vacuum extraction assisted delivery  3. [redacted] weeks gestation of pregnancy  4. Leiomyoma of uterus affecting pregnancy in third  trimester Normal growth [place], no further ordered  5. Language barrier, cultural differences Arabic interpretor used  Preterm labor symptoms and general obstetric precautions including but not limited to vaginal bleeding, contractions, leaking of fluid and fetal movement were reviewed in detail with the patient. Please refer to After Visit Summary for other counseling recommendations.   Return in about 1 week (around 01/05/2021) for in person, high OB, 36 week swabs.  No future appointments.  Sloan Leiter, MD

## 2020-12-29 NOTE — Progress Notes (Signed)
Pt has no concerns at this time. 

## 2021-01-05 ENCOUNTER — Other Ambulatory Visit (HOSPITAL_COMMUNITY)
Admission: RE | Admit: 2021-01-05 | Discharge: 2021-01-05 | Disposition: A | Discharge status: Home / Self Care | Payer: Medicaid Other | Source: Ambulatory Visit | Attending: Obstetrics and Gynecology | Admitting: Obstetrics and Gynecology

## 2021-01-05 ENCOUNTER — Other Ambulatory Visit: Payer: Self-pay

## 2021-01-05 ENCOUNTER — Ambulatory Visit (INDEPENDENT_AMBULATORY_CARE_PROVIDER_SITE_OTHER): Payer: Medicaid Other | Admitting: Obstetrics and Gynecology

## 2021-01-05 ENCOUNTER — Encounter: Payer: Self-pay | Admitting: Obstetrics and Gynecology

## 2021-01-05 VITALS — BP 120/65 | HR 75 | Wt 208.0 lb

## 2021-01-05 DIAGNOSIS — Z3483 Encounter for supervision of other normal pregnancy, third trimester: Secondary | ICD-10-CM | POA: Diagnosis present

## 2021-01-05 DIAGNOSIS — Z603 Acculturation difficulty: Secondary | ICD-10-CM

## 2021-01-05 DIAGNOSIS — O3413 Maternal care for benign tumor of corpus uteri, third trimester: Secondary | ICD-10-CM

## 2021-01-05 DIAGNOSIS — D259 Leiomyoma of uterus, unspecified: Secondary | ICD-10-CM

## 2021-01-05 NOTE — Progress Notes (Signed)
Subjective:  Betty Crawford is a 25 y.o. G2P1001 at [redacted]w[redacted]d being seen today for ongoing prenatal care.  She is currently monitored for the following issues for this low-risk pregnancy and has Language barrier, cultural differences; Uterine fibroids affecting pregnancy; Encounter for supervision of normal pregnancy in multigravida in third trimester; History of vacuum extraction assisted delivery; and Late prenatal care affecting pregnancy in second trimester on their problem list.  Patient reports no complaints.  Contractions: Not present. Vag. Bleeding: None.  Movement: Present. Denies leaking of fluid.   The following portions of the patient's history were reviewed and updated as appropriate: allergies, current medications, past family history, past medical history, past social history, past surgical history and problem list. Problem list updated.  Objective:   Vitals:   01/05/21 0930  BP: 120/65  Pulse: 75  Weight: 208 lb (94.3 kg)    Fetal Status: Fetal Heart Rate (bpm): 128   Movement: Present     General:  Alert, oriented and cooperative. Patient is in no acute distress.  Skin: Skin is warm and dry. No rash noted.   Cardiovascular: Normal heart rate noted  Respiratory: Normal respiratory effort, no problems with respiration noted  Abdomen: Soft, gravid, appropriate for gestational age. Pain/Pressure: Absent     Pelvic:  Cervical exam deferred        Extremities: Normal range of motion.  Edema: None  Mental Status: Normal mood and affect. Normal behavior. Normal judgment and thought content.   Urinalysis:      Assessment and Plan:  Pregnancy: G2P1001 at [redacted]w[redacted]d  1. Encounter for supervision of normal pregnancy in multigravida in third trimester Stable - Cervicovaginal ancillary only( [place]) - Strep Gp B NAA  2. Leiomyoma of uterus affecting pregnancy in third trimester Stable 16 % growth on 12/09/20, no additional growth scans needed  3. Language barrier, cultural  differences Live interrupter used during today's visit  Preterm labor symptoms and general obstetric precautions including but not limited to vaginal bleeding, contractions, leaking of fluid and fetal movement were reviewed in detail with the patient. Please refer to After Visit Summary for other counseling recommendations.  Return in about 1 week (around 01/12/2021) for OB visit, face to face, any provider, prefers female providers.   Chancy Milroy, MD

## 2021-01-05 NOTE — Progress Notes (Signed)
ROB GBS 

## 2021-01-05 NOTE — Patient Instructions (Signed)

## 2021-01-06 LAB — CERVICOVAGINAL ANCILLARY ONLY
Chlamydia: NEGATIVE
Comment: NEGATIVE
Comment: NORMAL
Neisseria Gonorrhea: NEGATIVE

## 2021-01-07 LAB — STREP GP B NAA: Strep Gp B NAA: NEGATIVE

## 2021-01-11 ENCOUNTER — Encounter: Payer: Medicaid Other | Admitting: Advanced Practice Midwife

## 2021-01-13 ENCOUNTER — Encounter: Payer: Medicaid Other | Admitting: Obstetrics and Gynecology

## 2021-01-14 ENCOUNTER — Ambulatory Visit (INDEPENDENT_AMBULATORY_CARE_PROVIDER_SITE_OTHER): Payer: Medicaid Other | Admitting: Obstetrics & Gynecology

## 2021-01-14 ENCOUNTER — Other Ambulatory Visit: Payer: Self-pay

## 2021-01-14 ENCOUNTER — Encounter: Payer: Medicaid Other | Admitting: Obstetrics & Gynecology

## 2021-01-14 VITALS — BP 115/70 | HR 83 | Wt 202.0 lb

## 2021-01-14 DIAGNOSIS — O0932 Supervision of pregnancy with insufficient antenatal care, second trimester: Secondary | ICD-10-CM

## 2021-01-14 DIAGNOSIS — Z603 Acculturation difficulty: Secondary | ICD-10-CM

## 2021-01-14 DIAGNOSIS — Z3483 Encounter for supervision of other normal pregnancy, third trimester: Secondary | ICD-10-CM

## 2021-01-14 NOTE — Progress Notes (Signed)
   PRENATAL VISIT NOTE  Subjective:  Betty Crawford is a 25 y.o. G2P1001 at [redacted]w[redacted]d being seen today for ongoing prenatal care.  She is currently monitored for the following issues for this low-risk pregnancy and has Language barrier, cultural differences; Uterine fibroids affecting pregnancy; Encounter for supervision of normal pregnancy in multigravida in third trimester; History of vacuum extraction assisted delivery; and Late prenatal care affecting pregnancy in second trimester on their problem list.  Patient reports no complaints.  Contractions: Not present. Vag. Bleeding: None.  Movement: Present. Denies leaking of fluid.   The following portions of the patient's history were reviewed and updated as appropriate: allergies, current medications, past family history, past medical history, past social history, past surgical history and problem list.   Objective:   Vitals:   01/14/21 1049  BP: 115/70  Pulse: 83  Weight: 202 lb (91.6 kg)    Fetal Status: Fetal Heart Rate (bpm): 135   Movement: Present     General:  Alert, oriented and cooperative. Patient is in no acute distress.  Skin: Skin is warm and dry. No rash noted.   Cardiovascular: Normal heart rate noted  Respiratory: Normal respiratory effort, no problems with respiration noted  Abdomen: Soft, gravid, appropriate for gestational age.  Pain/Pressure: Absent     Pelvic: Cervical exam deferred        Extremities: Normal range of motion.  Edema: None  Mental Status: Normal mood and affect. Normal behavior. Normal judgment and thought content.   Assessment and Plan:  Pregnancy: G2P1001 at [redacted]w[redacted]d 1. Encounter for supervision of normal pregnancy in multigravida in third trimester Doing well  2. Language barrier, cultural differences Arabic interpreter  3. Late prenatal care affecting pregnancy in second trimester GBS negative  Term labor symptoms and general obstetric precautions including but not limited to vaginal  bleeding, contractions, leaking of fluid and fetal movement were reviewed in detail with the patient. Please refer to After Visit Summary for other counseling recommendations.   Return in about 1 week (around 01/21/2021).  Future Appointments  Date Time Provider [place]  01/25/2021  9:50 AM Luvenia Redden, PA-C [facility] None    Emeterio Reeve, MD

## 2021-01-14 NOTE — Patient Instructions (Signed)

## 2021-01-14 NOTE — Progress Notes (Signed)
+   Fetal movement. No complaints.

## 2021-01-25 ENCOUNTER — Encounter: Payer: Self-pay | Admitting: Medical

## 2021-01-25 ENCOUNTER — Other Ambulatory Visit: Payer: Self-pay

## 2021-01-25 ENCOUNTER — Ambulatory Visit (INDEPENDENT_AMBULATORY_CARE_PROVIDER_SITE_OTHER): Payer: Medicaid Other | Admitting: Medical

## 2021-01-25 VITALS — BP 120/70 | HR 83 | Wt 206.0 lb

## 2021-01-25 DIAGNOSIS — O0932 Supervision of pregnancy with insufficient antenatal care, second trimester: Secondary | ICD-10-CM

## 2021-01-25 DIAGNOSIS — Z3A38 38 weeks gestation of pregnancy: Secondary | ICD-10-CM

## 2021-01-25 DIAGNOSIS — O3413 Maternal care for benign tumor of corpus uteri, third trimester: Secondary | ICD-10-CM

## 2021-01-25 DIAGNOSIS — Z603 Acculturation difficulty: Secondary | ICD-10-CM

## 2021-01-25 DIAGNOSIS — D259 Leiomyoma of uterus, unspecified: Secondary | ICD-10-CM

## 2021-01-25 DIAGNOSIS — Z8759 Personal history of other complications of pregnancy, childbirth and the puerperium: Secondary | ICD-10-CM

## 2021-01-25 DIAGNOSIS — Z3483 Encounter for supervision of other normal pregnancy, third trimester: Secondary | ICD-10-CM

## 2021-01-25 NOTE — Patient Instructions (Signed)
Fetal Movement Counts Patient Name: ________________________________________________ Patient Due Date: ____________________  What is a fetal movement count? A fetal movement count is the number of times that you feel your baby move during a certain amount of time. This may also be called a fetal kick count. A fetal movement count is recommended for every pregnant woman. You may be asked to start counting fetal movements as early as week 28 of your pregnancy. Pay attention to when your baby is most active. You may notice your baby's sleep and wake cycles. You may also notice things that make your baby move more. You should do a fetal movement count:  When your baby is normally most active.  At the same time each day. A good time to count movements is while you are resting, after having something to eat and drink. How do I count fetal movements? 1. Find a quiet, comfortable area. Sit, or lie down on your side. 2. Write down the date, the start time and stop time, and the number of movements that you felt between those two times. Take this information with you to your health care visits. 3. Write down your start time when you feel the first movement. 4. Count kicks, flutters, swishes, rolls, and jabs. You should feel at least 10 movements. 5. You may stop counting after you have felt 10 movements, or if you have been counting for 2 hours. Write down the stop time. 6. If you do not feel 10 movements in 2 hours, contact your health care provider for further instructions. Your health care provider may want to do additional tests to assess your baby's well-being. Contact a health care provider if:  You feel fewer than 10 movements in 2 hours.  Your baby is not moving like he or she usually does. Date: ____________ Start time: ____________ Stop time: ____________ Movements: ____________ Date: ____________ Start time: ____________ Stop time: ____________ Movements: ____________ Date: ____________  Start time: ____________ Stop time: ____________ Movements: ____________ Date: ____________ Start time: ____________ Stop time: ____________ Movements: ____________ Date: ____________ Start time: ____________ Stop time: ____________ Movements: ____________ Date: ____________ Start time: ____________ Stop time: ____________ Movements: ____________ Date: ____________ Start time: ____________ Stop time: ____________ Movements: ____________ Date: ____________ Start time: ____________ Stop time: ____________ Movements: ____________ Date: ____________ Start time: ____________ Stop time: ____________ Movements: ____________ This information is not intended to replace advice given to you by your health care provider. Make sure you discuss any questions you have with your health care provider. Document Revised: 06/12/2019 Document Reviewed: 06/12/2019 Elsevier Patient Education  [place]. [employer]'s Emergency Medicine: Concepts and Clinical Practice (9th ed., pp. 2296- 2312). Elsevier.">  Braxton Hicks Contractions Contractions of the uterus can occur throughout pregnancy, but they are not always a sign that you are in labor. You may have practice contractions called Braxton Hicks contractions. These false labor contractions are sometimes confused with true labor. What are [place] contractions? Braxton Hicks contractions are tightening movements that occur in the muscles of the uterus before labor. Unlike true labor contractions, these contractions do not result in opening (dilation) and thinning of the cervix. Toward the end of pregnancy (32-34 weeks), Braxton Hicks contractions can happen more often and may become stronger. These contractions are sometimes difficult to tell apart from true labor because they can be very uncomfortable. You should not feel embarrassed if you go to the hospital with false labor. Sometimes, the only way to tell if you are in true labor is for your  health care  provider to look for changes in the cervix. The health care provider will do a physical exam and may monitor your contractions. If you are not in true labor, the exam should show that your cervix is not dilating and your water has not broken. If there are no other health problems associated with your pregnancy, it is completely safe for you to be sent home with false labor. You may continue to have Braxton Hicks contractions until you go into true labor. How to tell the difference between true labor and false labor True labor  Contractions last 30-70 seconds.  Contractions become very regular.  Discomfort is usually felt in the top of the uterus, and it spreads to the lower abdomen and low back.  Contractions do not go away with walking.  Contractions usually become more intense and increase in frequency.  The cervix dilates and gets thinner. False labor  Contractions are usually shorter and not as strong as true labor contractions.  Contractions are usually irregular.  Contractions are often felt in the front of the lower abdomen and in the groin.  Contractions may go away when you walk around or change positions while lying down.  Contractions get weaker and are shorter-lasting as time goes on.  The cervix usually does not dilate or become thin. Follow these instructions at home:  Take over-the-counter and prescription medicines only as told by your health care provider.  Keep up with your usual exercises and follow other instructions from your health care provider.  Eat and drink lightly if you think you are going into labor.  If Braxton Hicks contractions are making you uncomfortable: ? Change your position from lying down or resting to walking, or change from walking to resting. ? Sit and rest in a tub of warm water. ? Drink enough fluid to keep your urine pale yellow. Dehydration may cause these contractions. ? Do slow and deep breathing several times an hour.  Keep  all follow-up prenatal visits as told by your health care provider. This is important.   Contact a health care provider if:  You have a fever.  You have continuous pain in your abdomen. Get help right away if:  Your contractions become stronger, more regular, and closer together.  You have fluid leaking or gushing from your vagina.  You pass blood-tinged mucus (bloody show).  You have bleeding from your vagina.  You have low back pain that you never had before.  You feel your baby's head pushing down and causing pelvic pressure.  Your baby is not moving inside you as much as it used to. Summary  Contractions that occur before labor are called Braxton Hicks contractions, false labor, or practice contractions.  Braxton Hicks contractions are usually shorter, weaker, farther apart, and less regular than true labor contractions. True labor contractions usually become progressively stronger and regular, and they become more frequent.  Manage discomfort from [facility] contractions by changing position, resting in a warm bath, drinking plenty of water, or practicing deep breathing. This information is not intended to replace advice given to you by your health care provider. Make sure you discuss any questions you have with your health care provider. Document Revised: 10/05/2017 Document Reviewed: 03/08/2017 Elsevier Patient Education  [place].

## 2021-01-25 NOTE — Progress Notes (Signed)
   PRENATAL VISIT NOTE  Subjective:  Betty Crawford is a 25 y.o. G2P1001 at [redacted]w[redacted]d being seen today for ongoing prenatal care.  She is currently monitored for the following issues for this low-risk pregnancy and has Language barrier, cultural differences; Uterine fibroids affecting pregnancy; Encounter for supervision of normal pregnancy in multigravida in third trimester; History of vacuum extraction assisted delivery; and Late prenatal care affecting pregnancy in second trimester on their problem list.  Patient reports no complaints.  Contractions: Not present. Vag. Bleeding: None.  Movement: Present. Denies leaking of fluid.   The following portions of the patient's history were reviewed and updated as appropriate: allergies, current medications, past family history, past medical history, past social history, past surgical history and problem list.   Objective:   Vitals:   01/25/21 0952  BP: 120/70  Pulse: 83  Weight: 206 lb (93.4 kg)    Fetal Status: Fetal Heart Rate (bpm): 125 Fundal Height: 37 cm Movement: Present     General:  Alert, oriented and cooperative. Patient is in no acute distress.  Skin: Skin is warm and dry. No rash noted.   Cardiovascular: Normal heart rate noted  Respiratory: Normal respiratory effort, no problems with respiration noted  Abdomen: Soft, gravid, appropriate for gestational age.  Pain/Pressure: Absent     Pelvic: Cervical exam performed in the presence of a chaperone       baby at -1 station, vertex, unable to reach cervix after multiple position changes   Extremities: Normal range of motion.  Edema: None  Mental Status: Normal mood and affect. Normal behavior. Normal judgment and thought content.   Assessment and Plan:  Pregnancy: G2P1001 at [redacted]w[redacted]d 1. Encounter for supervision of normal pregnancy in multigravida in third trimester - GBS negative discussed  - IOL scheduled 4/6, orders entered   2. Late prenatal care affecting pregnancy in second  trimeste  3. History of vacuum extraction assisted delivery  4. Leiomyoma of uterus affecting pregnancy in third trimester  5. Language barrier, cultural differences - Interpreter present   6. [redacted] weeks gestation of pregnancy  Term labor symptoms and general obstetric precautions including but not limited to vaginal bleeding, contractions, leaking of fluid and fetal movement were reviewed in detail with the patient. Please refer to After Visit Summary for other counseling recommendations.   Return in about 1 week (around 02/01/2021) for LOB, In-Person.  Future Appointments  Date Time Provider [place]  02/02/2021  9:45 AM Woodroe Mode, MD [place] None  02/09/2021  7:55 AM [facility] [place] None    Kerry Hough, PA-C

## 2021-01-26 ENCOUNTER — Telehealth (HOSPITAL_COMMUNITY): Payer: Self-pay | Admitting: *Deleted

## 2021-01-26 NOTE — Telephone Encounter (Signed)
Preadmission screen Interpreter number [phone]

## 2021-01-31 ENCOUNTER — Inpatient Hospital Stay (HOSPITAL_COMMUNITY): Payer: Medicaid Other | Admitting: Anesthesiology

## 2021-01-31 ENCOUNTER — Inpatient Hospital Stay (HOSPITAL_COMMUNITY)
Admission: AD | Admit: 2021-01-31 | Discharge: 2021-02-02 | DRG: 833 | Disposition: A | Discharge status: Home / Self Care | Payer: Medicaid Other | Attending: Obstetrics and Gynecology | Admitting: Obstetrics and Gynecology

## 2021-01-31 ENCOUNTER — Other Ambulatory Visit: Payer: Self-pay

## 2021-01-31 ENCOUNTER — Encounter (HOSPITAL_COMMUNITY): Payer: Self-pay

## 2021-01-31 DIAGNOSIS — Z30430 Encounter for insertion of intrauterine contraceptive device: Secondary | ICD-10-CM | POA: Diagnosis not present

## 2021-01-31 DIAGNOSIS — Z603 Acculturation difficulty: Secondary | ICD-10-CM

## 2021-01-31 DIAGNOSIS — O3413 Maternal care for benign tumor of corpus uteri, third trimester: Secondary | ICD-10-CM | POA: Diagnosis present

## 2021-01-31 DIAGNOSIS — O480 Post-term pregnancy: Secondary | ICD-10-CM | POA: Diagnosis present

## 2021-01-31 DIAGNOSIS — Z20822 Contact with and (suspected) exposure to covid-19: Secondary | ICD-10-CM | POA: Diagnosis present

## 2021-01-31 DIAGNOSIS — O368130 Decreased fetal movements, third trimester, not applicable or unspecified: Principal | ICD-10-CM

## 2021-01-31 DIAGNOSIS — Z3A39 39 weeks gestation of pregnancy: Secondary | ICD-10-CM | POA: Diagnosis not present

## 2021-01-31 DIAGNOSIS — D259 Leiomyoma of uterus, unspecified: Secondary | ICD-10-CM | POA: Diagnosis present

## 2021-01-31 DIAGNOSIS — O0932 Supervision of pregnancy with insufficient antenatal care, second trimester: Secondary | ICD-10-CM

## 2021-01-31 DIAGNOSIS — Z3483 Encounter for supervision of other normal pregnancy, third trimester: Secondary | ICD-10-CM

## 2021-01-31 DIAGNOSIS — Z8759 Personal history of other complications of pregnancy, childbirth and the puerperium: Secondary | ICD-10-CM

## 2021-01-31 LAB — CBC
HCT: 41.3 % (ref 36.0–46.0)
Hemoglobin: 13.4 g/dL (ref 12.0–15.0)
MCH: 25.5 pg — ABNORMAL LOW (ref 26.0–34.0)
MCHC: 32.4 g/dL (ref 30.0–36.0)
MCV: 78.7 fL — ABNORMAL LOW (ref 80.0–100.0)
Platelets: 146 10*3/uL — ABNORMAL LOW (ref 150–400)
RBC: 5.25 MIL/uL — ABNORMAL HIGH (ref 3.87–5.11)
RDW: 15.9 % — ABNORMAL HIGH (ref 11.5–15.5)
WBC: 10.2 10*3/uL (ref 4.0–10.5)
nRBC: 0 % (ref 0.0–0.2)

## 2021-01-31 LAB — RESP PANEL BY RT-PCR (FLU A&B, COVID) ARPGX2
Influenza A by PCR: NEGATIVE
Influenza B by PCR: NEGATIVE
SARS Coronavirus 2 by RT PCR: NEGATIVE

## 2021-01-31 LAB — TYPE AND SCREEN
ABO/RH(D): O POS
Antibody Screen: NEGATIVE

## 2021-01-31 MED ORDER — LIDOCAINE HCL (PF) 1 % IJ SOLN
INTRAMUSCULAR | Status: DC | PRN
  Administered 2021-01-31: 8 mL via EPIDURAL

## 2021-01-31 MED ORDER — ONDANSETRON HCL 4 MG/2ML IJ SOLN: 4.0000 mg | Freq: Four times a day (QID) | INTRAMUSCULAR | Status: DC | PRN

## 2021-01-31 MED ORDER — PRENATAL MULTIVITAMIN CH
1.0000 | ORAL_TABLET | Freq: Every day | ORAL | Status: DC
  Administered 2021-02-01 – 2021-02-02 (×2): 1 via ORAL
  Filled 2021-01-31 (×2): qty 1

## 2021-01-31 MED ORDER — EPHEDRINE 5 MG/ML INJ: 10.0000 mg | INTRAVENOUS | Status: DC | PRN

## 2021-01-31 MED ORDER — OXYTOCIN-SODIUM CHLORIDE 30-0.9 UT/500ML-% IV SOLN
1.0000 m[IU]/min | INTRAVENOUS | Status: DC
  Administered 2021-01-31: 2 m[IU]/min via INTRAVENOUS

## 2021-01-31 MED ORDER — LACTATED RINGERS IV SOLN: INTRAVENOUS | Status: DC

## 2021-01-31 MED ORDER — LACTATED RINGERS IV SOLN: 500.0000 mL | Freq: Once | INTRAVENOUS | Status: DC

## 2021-01-31 MED ORDER — ONDANSETRON HCL 4 MG PO TABS: 4.0000 mg | ORAL_TABLET | ORAL | Status: DC | PRN

## 2021-01-31 MED ORDER — PARAGARD INTRAUTERINE COPPER IU IUD
INTRAUTERINE_SYSTEM | Freq: Once | INTRAUTERINE | Status: AC
  Administered 2021-01-31: 1 via INTRAUTERINE
  Filled 2021-01-31: qty 1

## 2021-01-31 MED ORDER — TERBUTALINE SULFATE 1 MG/ML IJ SOLN: 0.2500 mg | Freq: Once | INTRAMUSCULAR | Status: DC | PRN

## 2021-01-31 MED ORDER — ACETAMINOPHEN 325 MG PO TABS
650.0000 mg | ORAL_TABLET | Freq: Four times a day (QID) | ORAL | Status: DC
  Administered 2021-02-01 (×5): 650 mg via ORAL
  Filled 2021-01-31 (×5): qty 2

## 2021-01-31 MED ORDER — ACETAMINOPHEN 325 MG PO TABS: 650.0000 mg | ORAL_TABLET | ORAL | Status: DC | PRN

## 2021-01-31 MED ORDER — OXYTOCIN-SODIUM CHLORIDE 30-0.9 UT/500ML-% IV SOLN
2.5000 [IU]/h | INTRAVENOUS | Status: DC
  Filled 2021-01-31: qty 500

## 2021-01-31 MED ORDER — BENZOCAINE-MENTHOL 20-0.5 % EX AERO: 1.0000 "application " | INHALATION_SPRAY | CUTANEOUS | Status: DC | PRN

## 2021-01-31 MED ORDER — OXYCODONE-ACETAMINOPHEN 5-325 MG PO TABS: 1.0000 | ORAL_TABLET | ORAL | Status: DC | PRN

## 2021-01-31 MED ORDER — MISOPROSTOL 25 MCG QUARTER TABLET: 25.0000 ug | ORAL_TABLET | ORAL | Status: DC | PRN

## 2021-01-31 MED ORDER — DIPHENHYDRAMINE HCL 25 MG PO CAPS: 25.0000 mg | ORAL_CAPSULE | Freq: Four times a day (QID) | ORAL | Status: DC | PRN

## 2021-01-31 MED ORDER — IBUPROFEN 600 MG PO TABS
600.0000 mg | ORAL_TABLET | Freq: Four times a day (QID) | ORAL | Status: DC
  Administered 2021-02-01 – 2021-02-02 (×6): 600 mg via ORAL
  Filled 2021-01-31 (×6): qty 1

## 2021-01-31 MED ORDER — FENTANYL-BUPIVACAINE-NACL 0.5-0.125-0.9 MG/250ML-% EP SOLN
12.0000 mL/h | EPIDURAL | Status: DC | PRN
  Administered 2021-01-31: 12 mL/h via EPIDURAL
  Filled 2021-01-31: qty 250

## 2021-01-31 MED ORDER — MISOPROSTOL 50MCG HALF TABLET: 50.0000 ug | ORAL_TABLET | ORAL | Status: DC | PRN

## 2021-01-31 MED ORDER — SENNOSIDES-DOCUSATE SODIUM 8.6-50 MG PO TABS
2.0000 | ORAL_TABLET | Freq: Every day | ORAL | Status: DC
  Administered 2021-02-01 – 2021-02-02 (×2): 2 via ORAL
  Filled 2021-01-31 (×2): qty 2

## 2021-01-31 MED ORDER — DIBUCAINE (PERIANAL) 1 % EX OINT: 1.0000 "application " | TOPICAL_OINTMENT | CUTANEOUS | Status: DC | PRN

## 2021-01-31 MED ORDER — PHENYLEPHRINE 40 MCG/ML (10ML) SYRINGE FOR IV PUSH (FOR BLOOD PRESSURE SUPPORT): 80.0000 ug | PREFILLED_SYRINGE | INTRAVENOUS | Status: DC | PRN

## 2021-01-31 MED ORDER — OXYTOCIN BOLUS FROM INFUSION
333.0000 mL | Freq: Once | INTRAVENOUS | Status: AC
  Administered 2021-01-31: 333 mL via INTRAVENOUS

## 2021-01-31 MED ORDER — PHENYLEPHRINE 40 MCG/ML (10ML) SYRINGE FOR IV PUSH (FOR BLOOD PRESSURE SUPPORT)
80.0000 ug | PREFILLED_SYRINGE | INTRAVENOUS | Status: DC | PRN
  Administered 2021-01-31 (×2): 80 ug via INTRAVENOUS
  Filled 2021-01-31: qty 10

## 2021-01-31 MED ORDER — COCONUT OIL OIL: 1.0000 "application " | TOPICAL_OIL | Status: DC | PRN

## 2021-01-31 MED ORDER — OXYCODONE-ACETAMINOPHEN 5-325 MG PO TABS: 2.0000 | ORAL_TABLET | ORAL | Status: DC | PRN

## 2021-01-31 MED ORDER — DIPHENHYDRAMINE HCL 50 MG/ML IJ SOLN: 12.5000 mg | INTRAMUSCULAR | Status: DC | PRN

## 2021-01-31 MED ORDER — FENTANYL CITRATE (PF) 100 MCG/2ML IJ SOLN
100.0000 ug | INTRAMUSCULAR | Status: DC | PRN
Start: 2021-01-31 — End: 2021-01-31
  Administered 2021-01-31: 100 ug via INTRAVENOUS
  Filled 2021-01-31: qty 2

## 2021-01-31 MED ORDER — LIDOCAINE HCL (PF) 1 % IJ SOLN: 30.0000 mL | INTRAMUSCULAR | Status: DC | PRN

## 2021-01-31 MED ORDER — SIMETHICONE 80 MG PO CHEW: 80.0000 mg | CHEWABLE_TABLET | ORAL | Status: DC | PRN

## 2021-01-31 MED ORDER — ONDANSETRON HCL 4 MG/2ML IJ SOLN: 4.0000 mg | INTRAMUSCULAR | Status: DC | PRN

## 2021-01-31 MED ORDER — TETANUS-DIPHTH-ACELL PERTUSSIS 5-2.5-18.5 LF-MCG/0.5 IM SUSY: 0.5000 mL | PREFILLED_SYRINGE | Freq: Once | INTRAMUSCULAR | Status: DC

## 2021-01-31 MED ORDER — SOD CITRATE-CITRIC ACID 500-334 MG/5ML PO SOLN: 30.0000 mL | ORAL | Status: DC | PRN

## 2021-01-31 MED ORDER — LACTATED RINGERS IV SOLN: 500.0000 mL | INTRAVENOUS | Status: DC | PRN

## 2021-01-31 MED ORDER — WITCH HAZEL-GLYCERIN EX PADS: 1.0000 "application " | MEDICATED_PAD | CUTANEOUS | Status: DC | PRN

## 2021-01-31 NOTE — Anesthesia Procedure Notes (Signed)
Epidural Patient location during procedure: OB Start time: 01/31/2021 7:50 PM End time: 01/31/2021 7:57 PM  Staffing Anesthesiologist: Merlinda Frederick, MD Performed: anesthesiologist   Preanesthetic Checklist Completed: patient identified, IV checked, site marked, risks and benefits discussed, monitors and equipment checked, pre-op evaluation and timeout performed  Epidural Patient position: sitting Prep: DuraPrep Patient monitoring: heart rate, cardiac monitor, continuous pulse ox and blood pressure Approach: midline Location: L2-L3 Injection technique: LOR saline  Needle:  Needle type: Tuohy  Needle gauge: 17 G Needle length: 9 cm Needle insertion depth: 5.5 cm Catheter type: closed end flexible Catheter size: 20 Guage Catheter at skin depth: 10.5 cm Test dose: negative and Other  Assessment Events: blood not aspirated, injection not painful, no injection resistance and negative IV test  Additional Notes Informed consent obtained prior to proceeding including risk of failure, 1% risk of PDPH, risk of minor discomfort and bruising.  Discussed rare but serious complications including epidural abscess, permanent nerve injury, epidural hematoma.  Discussed alternatives to epidural analgesia and patient desires to proceed.  Timeout performed pre-procedure verifying patient name, procedure, and platelet count.  Patient tolerated procedure well.

## 2021-01-31 NOTE — MAU Note (Signed)
Betty Crawford is a 25 y.o. at [redacted]w[redacted]d here in MAU reporting: 2 episodes of heavy bleeding. States she is also having some contractions about every 15 minutes. States no clear leaking. Also reporting DFM today. No recent IC.  Onset of complaint: today  Pain score: 4/10  Vitals:   01/31/21 1454 01/31/21 1455  BP: 134/70   Pulse: 79   Resp: 16   Temp: 99.2 F (37.3 C)   SpO2:  96%     FHT: EFM applied in room  Lab orders placed from triage: none

## 2021-01-31 NOTE — H&P (Addendum)
OBSTETRIC ADMISSION HISTORY AND PHYSICAL  Betty Crawford is a 25 y.o. female G2P1001 with IUP at [redacted]w[redacted]d presenting for VB and decreased FM. No LOF, VB, blurry vision, headaches, peripheral edema, or RUQ pain. She plans on breastfeeding. She requests post placental IUD for birth control.  Dating: By LMP --->  Estimated Date of Delivery: 02/02/21  Sono:    @[redacted]w[redacted]d , normal anatomy, ceph presentation, 1731g, 16%ile, EFW 3'13   Prenatal History/Complications: - language barrier - short interval b/w pregnancies - hx of VAVD - uterine fibroids - late prenatal care  Past Medical History: Past Medical History:  Diagnosis Date  . Fibroid   . Post term pregnancy at [redacted] weeks gestation 01/09/2020  . Supervision of normal first pregnancy, antepartum 07/21/2019   .Marland Kitchen Nursing Staff Provider Office Location  [place] Dating  LMP c/w 1st trim Language  Arabic Anatomy US   normal but limited due to fibroids, f/u scheduled Flu Vaccine  07-21-19 Genetic Screen  NIPS: Low risk female  AFP:   First Screen:  Quad:   TDaP vaccine   Hgb A1C or  GTT Early  Third trimester 2 hour wnl Rhogam  n/a   LAB RESULTS  Feeding Plan Breast/Bottle Blood Type O/Positive/-- (09/14 1419    Past Surgical History: Past Surgical History:  Procedure Laterality Date  . NO PAST SURGERIES      Obstetrical History: OB History    Gravida  2   Para  1   Term  1   Preterm      AB      Living  1     SAB      IAB      Ectopic      Multiple  0   Live Births  1           Social History: Social History   Socioeconomic History  . Marital status: Married    Spouse name: Not on file  . Number of children: Not on file  . Years of education: Not on file  . Highest education level: Not on file  Occupational History  . Not on file  Tobacco Use  . Smoking status: Never Smoker  . Smokeless tobacco: Never Used  Vaping Use  . Vaping Use: Never used  Substance and Sexual Activity  . Alcohol use: Never  . Drug use:  Never  . Sexual activity: Yes    Partners: Male    Birth control/protection: None  Other Topics Concern  . Not on file  Social History Narrative  . Not on file   Social Determinants of Health   Financial Resource Strain: Not on file  Food Insecurity: Not on file  Transportation Needs: Not on file  Physical Activity: Not on file  Stress: Not on file  Social Connections: Not on file    Family History: Family History  Problem Relation Age of Onset  . Cancer Neg Hx   . Diabetes Neg Hx   . Hypertension Neg Hx     Allergies: No Known Allergies  Medications Prior to Admission  Medication Sig Dispense Refill Last Dose  . famotidine (PEPCID) 20 MG tablet Take 1 tablet (20 mg total) by mouth daily. Can increase to twice daily if needed. 60 tablet 2   . Prenatal Vit-Fe Fumarate-FA (PRENATAL MULTIVITAMIN) TABS tablet Take 1 tablet by mouth daily at 12 noon.        Review of Systems:  All systems reviewed and negative except as stated in HPI  PE: Blood pressure 134/70, pulse 79, temperature 99.2 F (37.3 C), temperature source Oral, resp. rate 16, last menstrual period 04/19/2020, SpO2 96 %, unknown if currently breastfeeding. General appearance: alert, cooperative and no distress Lungs: regular rate and effort Heart: regular rate  Abdomen: soft, non-tender Extremities: Homans sign is negative, no sign of DVT Presentation: cephalic per BSUS in MAU EFM: 130 bpm, mod variability, + accels, occ variable decels Toco: irreg SVE: 2/80/-2, vtx  Prenatal labs: ABO, Rh: O/Positive/-- (10/26 1414) Antibody: Negative (10/26 1414) Rubella: 24.80 (10/26 1414) RPR: Non Reactive (01/11 0935)  HBsAg: Negative (10/26 1414)  HIV: Non Reactive (01/11 0935)  GBS: Negative/-- (03/02 1100)  2 hr GTT normal  Prenatal Transfer Tool  Maternal Diabetes: No Genetic Screening: Normal Maternal Ultrasounds/Referrals: Normal Fetal Ultrasounds or other Referrals:  None Maternal Substance  Abuse:  No Significant Maternal Medications:  None Significant Maternal Lab Results: Group B Strep negative  No results found for this or any previous visit (from the past 24 hour(s)).  Patient Active Problem List   Diagnosis Date Noted  . Post-dates pregnancy 01/31/2021  . History of vacuum extraction assisted delivery 08/31/2020  . Late prenatal care affecting pregnancy in second trimester 08/31/2020  . Encounter for supervision of normal pregnancy in multigravida in third trimester 08/17/2020  . Uterine fibroids affecting pregnancy 11/14/2019  . Language barrier, cultural differences 06/27/2019    Assessment: Betty Crawford is a 25 y.o. G2P1001 at [redacted]w[redacted]d here for VB and DFM at term  1. Labor: latent 2. FWB: Cat II 3. Pain: analgesia/anesthesia prn 4. GBS: neg   Plan: Admit to LD Pitocin IOL Anticipate SVD  Spouse used for translation (consent signed) Julianne Handler, CNM  01/31/2021, 3:35 PM

## 2021-01-31 NOTE — Progress Notes (Signed)
Labor Progress Note Betty Crawford is a 25 y.o. G2P1001 at [redacted]w[redacted]d presented for IOL for DFM and VB at term  S:  Feeling more painful ctx, requesting epidural.  O:  BP (!) 110/59   Pulse 66   Temp 97.6 F (36.4 C) (Oral)   Resp 18   Ht 5\' 4"  (1.626 m)   Wt 93 kg   LMP 04/19/2020 (Exact Date)   SpO2 96%   BMI 35.19 kg/m  EFM: baseline 125 bpm/ mod variability/ + accels/ no decels  Toco/IUPC: 2-4 SVE: deferred Pitocin: 6 mu/min  A/P: 25 y.o. G2P1001 [redacted]w[redacted]d  1. Labor: latent 2. FWB: Cat I 3. Pain: planning epidural   Continue Pitocin. No signs of bleeding or abruption. Anticipate labor progress and SVD.  Julianne Handler, CNM 7:27 PM

## 2021-01-31 NOTE — Anesthesia Preprocedure Evaluation (Signed)
Anesthesia Evaluation  Patient identified by MRN, date of birth, ID band Patient awake    Reviewed: Allergy & Precautions, Patient's Chart, lab work & pertinent test results  Airway Mallampati: II  TM Distance: >3 FB Neck ROM: Full    Dental no notable dental hx.    Pulmonary neg pulmonary ROS,    Pulmonary exam normal breath sounds clear to auscultation       Cardiovascular negative cardio ROS Normal cardiovascular exam Rhythm:Regular Rate:Normal     Neuro/Psych negative neurological ROS  negative psych ROS   GI/Hepatic negative GI ROS, Neg liver ROS,   Endo/Other  negative endocrine ROS  Renal/GU negative Renal ROS  negative genitourinary   Musculoskeletal negative musculoskeletal ROS (+)   Abdominal   Peds  Hematology negative hematology ROS (+)   Anesthesia Other Findings   Reproductive/Obstetrics (+) Pregnancy                             Anesthesia Physical  Anesthesia Plan  ASA: II  Anesthesia Plan: Epidural   Post-op Pain Management:    Induction:   PONV Risk Score and Plan: Treatment may vary due to age or medical condition  Airway Management Planned: Natural Airway  Additional Equipment:   Intra-op Plan:   Post-operative Plan:   Informed Consent: I have reviewed the patients History and Physical, chart, labs and discussed the procedure including the risks, benefits and alternatives for the proposed anesthesia with the patient or authorized representative who has indicated his/her understanding and acceptance.       Plan Discussed with: Anesthesiologist  Anesthesia Plan Comments: (Patient identified. Risks, benefits, options discussed with patient including but not limited to bleeding, infection, nerve damage, paralysis, failed block, incomplete pain control, headache, blood pressure changes, nausea, vomiting, reactions to medication, itching, and post partum  back pain. Confirmed with bedside nurse the patient's most recent platelet count. Confirmed with the patient that they are not taking any anticoagulation, have any bleeding history or any family history of bleeding disorders. Patient expressed understanding and wishes to proceed. All questions were answered. )        Anesthesia Quick Evaluation

## 2021-01-31 NOTE — Discharge Summary (Addendum)
Postpartum Discharge Summary    Patient Name: Betty Crawford DOB: 01/08/1996 MRN: [id number]  Date of admission: 01/31/2021 Delivery date:01/31/2021  Delivering provider: Randa Ngo  Date of discharge: 02/02/2021  Admitting diagnosis: Post-dates pregnancy [O48.0] Intrauterine pregnancy: [redacted]w[redacted]d     Secondary diagnosis:  Principal Problem:   Vaginal delivery Active Problems:   Language barrier, cultural differences   History of vacuum extraction assisted delivery   Late prenatal care affecting pregnancy in second trimester   Post-dates pregnancy   Encounter for IUD insertion  Additional problems: as noted above  Discharge diagnosis: Term Pregnancy Delivered                                              Post partum procedures:post-placental Paraguard IUD insertion Augmentation: AROM and Pitocin Complications: None  Hospital course: Induction of Labor With Vaginal Delivery   25 y.o. G2P1001 at [redacted]w[redacted]d was admitted to the hospital 01/31/2021 for induction of labor.  Indication for induction: vaginal bleeding, decreased fetal movement in the setting of a non-reactive NST.  Patient had an uncomplicated labor course as follows: Membrane Rupture Time/Date: 8:41 PM ,01/31/2021   Delivery Method:Vaginal, Spontaneous  Episiotomy: None  Lacerations:  None  Details of delivery can be found in separate delivery note.  Patient had a routine postpartum course. She is ambulating, tolerating a regular diet, voiding and passing flatus. Patient is discharged home 02/02/21.  Newborn Data: Birth date:01/31/2021  Birth time:8:55 PM  Gender:Female  Living status:Living  Apgars:8 ,9  Weight:2580 g   Magnesium Sulfate received: No BMZ received: No Rhophylac:N/A MMR:N/A T-DaP:offered prior to discharge Flu: offered prior to discharge Transfusion:No  Physical exam  Vitals:   02/01/21 0800 02/01/21 1145 02/01/21 1955 02/02/21 0558  BP: (!) 108/52 (!) 104/55 (!) 102/54 (!) 108/53  Pulse: 62 66 64  66  Resp: $Remo'16 16 16 16  '[place]$ Temp: 98.6 F (37 C) 98.7 F (37.1 C) 98.4 F (36.9 C) 98.2 F (36.8 C)  TempSrc: Oral Oral Oral Oral  SpO2: 100% 99% 98% 96%  Weight:      Height:       General: alert, cooperative and no distress Lochia: appropriate Uterine Fundus: firm Incision: N/A DVT Evaluation: No evidence of DVT seen on physical exam. Labs: Lab Results  Component Value Date   WBC 12.2 (H) 02/01/2021   HGB 13.4 02/01/2021   HCT 39.9 02/01/2021   MCV 77.6 (L) 02/01/2021   PLT 139 (L) 02/01/2021   No flowsheet data found. Edinburgh Score: Edinburgh Postnatal Depression Scale Screening Tool 02/01/2021  I have been able to laugh and see the funny side of things. 0  I have looked forward with enjoyment to things. 0  I have blamed myself unnecessarily when things went wrong. 0  I have been anxious or worried for no good reason. 0  I have felt scared or panicky for no good reason. 0  Things have been getting on top of me. 0  I have been so unhappy that I have had difficulty sleeping. 0  I have felt sad or miserable. 0  I have been so unhappy that I have been crying. 0  The thought of harming myself has occurred to me. 0  Edinburgh Postnatal Depression Scale Total 0     After visit meds:  Allergies as of 02/02/2021   No Known Allergies  Medication List    STOP taking these medications   famotidine 20 MG tablet Commonly known as: Pepcid     TAKE these medications   acetaminophen 325 MG tablet Commonly known as: Tylenol Take 2 tablets (650 mg total) by mouth every 6 (six) hours as needed.   coconut oil Oil Apply 1 application topically as needed.   ibuprofen 600 MG tablet Commonly known as: ADVIL Take 1 tablet (600 mg total) by mouth every 6 (six) hours.   prenatal multivitamin Tabs tablet Take 1 tablet by mouth daily at 12 noon.        Discharge home in stable condition Infant Feeding: Breast Infant Disposition:home with mother Discharge instruction:  per After Visit Summary and Postpartum booklet. Activity: Advance as tolerated. Pelvic rest for 6 weeks.  Diet: routine diet Future Appointments: Future Appointments  Date Time Provider [place]  03/17/2021 10:00 AM Constant, Vickii Chafe, MD [place] None   Follow up Visit: [facility] by Dr. Astrid Drafts.  Please schedule this patient for a In person postpartum visit in 6 weeks with the following provider: Any provider. Additional Postpartum F/U:late prenatal care  Low risk pregnancy complicated by: late prenatal care, language barrier Delivery mode:  Vaginal, Spontaneous  Anticipated Birth Control:  PP IUD placed (Paraguard)   02/02/2021 Alcus Dad, MD  GME ATTESTATION:  I saw and evaluated the patient. I agree with the findings and the plan of care as documented in the resident's note.  Arrie Senate, MD OB Fellow, [place] for [place] 02/02/2021 8:23 AM

## 2021-01-31 NOTE — MAU Provider Note (Addendum)
Event Date/Time   First Provider Initiated Contact with Patient 01/31/21 1505       S: Ms. Betty Crawford is a 25 y.o. G2P1001 at [redacted]w[redacted]d who presents to MAU today complaining of bleeding and DFM since this morning. Patient reports 2 episodes of vaginal bleeding with a large amount of bleeding on tissue after wiping with urination x2 along with seeing some blood in her underwear. Patient denies recent intercourse. She endorses contractions. She reports normal fetal movement.    O: BP 120/69   Pulse 69   Temp 97.6 F (36.4 C) (Oral)   Resp 17   Ht 5\' 4"  (1.626 m)   LMP 04/19/2020 (Exact Date)   SpO2 96%   BMI 35.36 kg/m    Patient Vitals for the past 24 hrs:  BP Temp Temp src Pulse Resp SpO2 Height  01/31/21 1620 120/69 97.6 F (36.4 C) Oral 69 17 -- 5\' 4"  (1.626 m)  01/31/21 1455 -- -- -- -- -- 96 % --  01/31/21 1454 134/70 99.2 F (37.3 C) Oral 79 16 -- --   GENERAL: Well-developed, well-nourished female in no acute distress.  HEAD: Normocephalic, atraumatic.  CHEST: Normal effort of breathing, regular heart rate ABDOMEN: Soft, nontender, gravid PELVIC: Normal external female genitalia. Vagina is pink and rugated. Cervix friable with bleeding. Sm amount of dark red blood pooling in vaginal vault mixed with mucus.   Cervical exam: not performed, pt not contracting  Pt informed that the ultrasound is considered a limited OB ultrasound and is not intended to be a complete ultrasound exam.  Patient also informed that the ultrasound is not being completed with the intent of assessing for fetal or placental anomalies or any pelvic abnormalities.  Explained that the purpose of today's ultrasound is to assess for presentation (VTX).  Patient acknowledges the purpose of the exam and the limitations of the study.    Fetal Monitoring: reactive Baseline: 135 Variability: moderate Accelerations: present, 15x15 Decelerations: absent Contractions: few, irregular  Results for orders placed  or performed during the hospital encounter of 01/31/21 (from the past 24 hour(s))  Type and screen     Status: None   Collection Time: 01/31/21  3:50 PM  Result Value Ref Range   ABO/RH(D) O POS    Antibody Screen NEG    Sample Expiration      02/03/2021,2359 Performed at [facility] [address] [place], [place] [address]   CBC     Status: Abnormal   Collection Time: 01/31/21  3:51 PM  Result Value Ref Range   WBC 10.2 4.0 - 10.5 K/uL   RBC 5.25 (H) 3.87 - 5.11 MIL/uL   Hemoglobin 13.4 12.0 - 15.0 g/dL   HCT 41.3 36.0 - 46.0 %   MCV 78.7 (L) 80.0 - 100.0 fL   MCH 25.5 (L) 26.0 - 34.0 pg   MCHC 32.4 30.0 - 36.0 g/dL   RDW 15.9 (H) 11.5 - 15.5 %   Platelets 146 (L) 150 - 400 K/uL   nRBC 0.0 0.0 - 0.2 %    A: SIUP at [redacted]w[redacted]d  Vaginal bleeding on exam with DFM  P: Consulted with Dr. Nehemiah Settle, will admit to L&D for delivery; report given to M. Drake Leach, CNM  Arabic interpreter used for entire visit, pt and husband offered Arabic interpreter, but pt and husband prefer husband to act as interpreter  Derenda Giddings, Gerrie Nordmann, NP 01/31/2021 5:03 PM

## 2021-02-01 LAB — CBC
HCT: 39.9 % (ref 36.0–46.0)
Hemoglobin: 13.4 g/dL (ref 12.0–15.0)
MCH: 26.1 pg (ref 26.0–34.0)
MCHC: 33.6 g/dL (ref 30.0–36.0)
MCV: 77.6 fL — ABNORMAL LOW (ref 80.0–100.0)
Platelets: 139 10*3/uL — ABNORMAL LOW (ref 150–400)
RBC: 5.14 MIL/uL — ABNORMAL HIGH (ref 3.87–5.11)
RDW: 15.9 % — ABNORMAL HIGH (ref 11.5–15.5)
WBC: 12.2 10*3/uL — ABNORMAL HIGH (ref 4.0–10.5)
nRBC: 0 % (ref 0.0–0.2)

## 2021-02-01 LAB — RPR: RPR Ser Ql: NONREACTIVE

## 2021-02-01 NOTE — Anesthesia Postprocedure Evaluation (Signed)
Anesthesia Post Note  Patient: Betty Crawford  Procedure(s) Performed: AN AD HOC LABOR EPIDURAL     Patient location during evaluation: Mother Baby Anesthesia Type: Epidural Level of consciousness: awake, awake and alert and oriented Pain management: pain level controlled Vital Signs Assessment: post-procedure vital signs reviewed and stable Respiratory status: spontaneous breathing and respiratory function stable Cardiovascular status: blood pressure returned to baseline Postop Assessment: no headache, epidural receding, patient able to bend at knees, adequate PO intake, no backache, no apparent nausea or vomiting and able to ambulate Anesthetic complications: no Comments: Pts husband at bedside acted as interpreter.   No complications documented.  Last Vitals:  Vitals:   02/01/21 0004 02/01/21 0406  BP: 111/61 126/68  Pulse: 76 70  Resp: 16 16  Temp: 37 C 36.8 C  SpO2: 100% 99%    Last Pain:  Vitals:   02/01/21 0720  TempSrc:   PainSc: Asleep   Pain Goal: Patients Stated Pain Goal: 0 (01/31/21 1729)                 Bufford Spikes

## 2021-02-01 NOTE — Progress Notes (Addendum)
POSTPARTUM PROGRESS NOTE  PPD #1  Subjective:  Betty Crawford is a 25 y.o. [id number] s/p vaginal delivery following IOL for vaginal bleeding and decreased fetal movement at [redacted]w[redacted]d. No acute events overnight. She reports she is doing well. She denies any problems with ambulating, voiding or po intake. Denies nausea or vomiting. She has passed flatus. Pain is well controlled.  Lochia is appropriate.  Objective: Blood pressure 111/61, pulse 76, temperature 98.6 F (37 C), temperature source Oral, resp. rate 16, height 5\' 4"  (1.626 m), weight 93 kg, last menstrual period 04/19/2020, SpO2 100 %.  Physical Exam:  General: alert, cooperative and no distress Chest: no respiratory distress Heart:regular rate, distal pulses intact Abdomen: soft, nontender, gravid Uterine Fundus: firm, appropriately tender DVT Evaluation: No calf swelling or tenderness Extremities: 1+ edema Skin: warm, dry  Recent Labs    01/31/21 1551  HGB 13.4  HCT 41.3    Assessment/Plan: Betty Crawford is a 25 y.o. [id number] s/p vaginal delivery following IOL for vaginal bleeding and decreased fetal movement at [redacted]w[redacted]d.  PPD#1 - Doing well; pain well controlled.  Routine postpartum care  OOB, ambulated Contraception: post-placental IUD (paraguard) placed. Counseled to f/u at postpartum visit. Feeding: breast Late prenatal care: request for social work consult  Dispo: Pt meeting postpartum milestones. Re-evaluate for discharge 3/30.   LOS: 1 day   Genelle Gather, MS3 02/01/2021, 4:03 AM  Attestation of Supervision of Student:  I confirm that I have verified the information documented in the medical student's note and that I have also personally reperformed the history, physical exam and all medical decision making activities.  I have verified that all services and findings are accurately documented in this student's note; and I agree with management and plan as outlined in the documentation. I have also made any  necessary editorial changes.  Randa Ngo, [place] for [facility], [place] [facility] 02/01/2021 8:44 AM

## 2021-02-01 NOTE — Lactation Note (Signed)
This note was copied from a baby's chart. Lactation Consultation Note Baby is 32 hrs old. Mom c/o baby wanting to constantly feed. Mom stated the baby needs something. FOB at bedside interpreting for mom. Mom denied use of [employer] interpreters.  FOB stated that the baby will not sleep in bed he wants to BF all the time and be held.  Newborn feeding habits and behaviors discussed.  LPI information sheet given since baby is less than 6 lbs for the amount to supplement according to hours of age. Encouraged mom to give her EBM first and don't mix w/anything else. FOB stated the baby needs more than what can give. LC suggested DM. FOB asked about formula.   Encouraged mom to pump every 3 hrs. FOB stated mom needs to sleep. Mom's eyes are blood shot red.  LC attempted to give baby EBM 58f 5 ml baby was to sleepy and wouldn't take it. LC checked diaper for stimulation. Baby had stool. LC changed diaper. RN needed to do assessment. No jitteriness noted.  Formula given w/instructions.   Patient Name: Boy Tashera Montalvo [id number] Date: 02/01/2021 Reason for consult: Mother's request;Infant < 6lbs Age:97 hours  Maternal Data    Feeding    LATCH Score Latch: Grasps breast easily, tongue down, lips flanged, rhythmical sucking.  Audible Swallowing: A few with stimulation  Type of Nipple: Everted at rest and after stimulation  Comfort (Breast/Nipple): Soft / non-tender  Hold (Positioning): Assistance needed to correctly position infant at breast and maintain latch.  LATCH Score: 8   Lactation Tools Discussed/Used Tools: Pump Breast pump type: Double-Electric Breast Pump Pump Education: Milk Storage Reason for Pumping: less than 6 lbs/supplementation Pumping frequency: Q3 hr  Interventions Interventions: Support pillows;Assisted with latch;Position options;Skin to skin;Expressed milk;Breast massage;Hand express;DEBP;Breast compression;Adjust position  Discharge    Consult  Status Consult Status: Follow-up Date: 02/02/21 Follow-up type: In-patient    Theodoro Kalata 02/01/2021, 11:20 PM

## 2021-02-01 NOTE — Lactation Note (Signed)
This note was copied from a baby's chart. Lactation Consultation Note  Patient Name: Betty Crawford [id number] Date: 02/01/2021 Reason for consult: Initial assessment;Term;Infant < 6lbs;Other (Comment) (blood sugars have been low , 39,41 ,34, 44. Arabic interpreter [phone]) Age:26 hours As LC entered the room , baby latched shallow, non - nutritive, off for few seconds and LC showed mom how deep on to the areola the baby should be to keep the baby nutritive. Increased swallows noted. Baby then released few minutes later. Per RN the original latch started at 810 am - feeding for 20 mins .  LC reviewed breast feeding goals for 24 hours 8-12 times, feed with feeding cues and if baby not showing feeding cues by 3 hours STS . Since the baby is less than 6 pounds and has had low blood sugars , spoon feeding would be recommended before or after the feeding.  LC mentioned to mom and dad post pumping would be indicated due to low weight and blood sugars. [place] aware to set up the DEBP .   Maternal Data Has patient been taught Hand Expression?: Yes (easily hand expressed after latch)  Feeding Mother's Current Feeding Choice: Breast Milk  LATCH Score Latch: Grasps breast easily, tongue down, lips flanged, rhythmical sucking. (baby released on his own)  Audible Swallowing: Spontaneous and intermittent  Type of Nipple: Everted at rest and after stimulation  Comfort (Breast/Nipple): Soft / non-tender  Hold (Positioning): Assistance needed to correctly position infant at breast and maintain latch.  LATCH Score: 9   Lactation Tools Discussed/Used    Interventions Interventions: Breast feeding basics reviewed;Assisted with latch;Skin to skin;Hand express;Breast compression;Adjust position  Discharge Pump:  (will need a DEBP) WIC Program: Yes  WIC referral will be sent 3/29   Consult Status Consult Status: Follow-up Date: 02/01/21 Follow-up type:  In-patient    [place] 02/01/2021, 8:54 AM

## 2021-02-01 NOTE — Social Work (Signed)
CSW received consult for late and limited PNC.  CSW reviewed chart and is screening out consult as it does not meet criteria for automatic CSW involvement and infant drug screening.  MOB started care prior to 28 weeks at [redacted]w[redacted]d, and had more than 3 visits.    Please contact the Clinical Social Worker if needs arise, by [facility] request, or if MOB scores greater than 9/yes to question 10 on Edinburgh Postpartum Depression Screen.  Darra Lis, [place] [employer]  [phone]

## 2021-02-01 NOTE — Lactation Note (Signed)
This note was copied from a baby's chart. Lactation Consultation Note  Patient Name: Betty Crawford [id number] Date: 02/01/2021 Reason for consult: Follow-up assessment;Term Age:26 hours   Follow-up visit to 20 hours infant with 0.39% weight loss.   Infant is latched cradle position to right breast upon arrival. LC observed shallow latch, swaddled infant and sub-optimal position. LC offered assistance to improve latch. Parents agreed. Unswaddled infant and assisted with latch to right breast, cradle position. Colostrum is easily expressed. Latched infant after a few attempts. Used support pillows. Noted suckling and swallowing. Mother denies discomfort or pain.   Educated mom about deep latch for an optimal breastfeeding session. Encouraged mother to continue pumping and supplement with EBM as needed.   Feeding plan:  1-Skin to skin 2-Aim for a deep, comfortable latch 3-Breastfeeding on demand or 8-12 times in 24h period. 4-Keep infant awake during breastfeeding session: massaging breast, infant's hand/shoulder/feet 5-Pump or hand-express and offer EBM. 6-If needed, supplement following guidelines, paced bottle feeding and fullness cues.  7-Monitor voids and stools as signs good intake.  6-Encouraged maternal rest, hydration and food intake.  7-Contact LC as needed for feeds/support/concerns/questions.  Maternal Data Has patient been taught Hand Expression?: Yes Does the patient have breastfeeding experience prior to this delivery?: Yes  Feeding Mother's Current Feeding Choice: Breast Milk  LATCH Score Latch: Grasps breast easily, tongue down, lips flanged, rhythmical sucking.  Audible Swallowing: Spontaneous and intermittent  Type of Nipple: Everted at rest and after stimulation (short shafted)  Comfort (Breast/Nipple): Soft / non-tender  Hold (Positioning): Assistance needed to correctly position infant at breast and maintain latch.  LATCH Score:  9  Interventions Interventions: Breast feeding basics reviewed;Assisted with latch;Adjust position;Support pillows;Expressed milk;Breast massage;Hand express;DEBP;Education  Consult Status Consult Status: Follow-up Date: 02/02/21 Follow-up type: In-patient    Raylen Tangonan A Higuera [place] 02/01/2021, 5:05 PM

## 2021-02-02 ENCOUNTER — Encounter: Payer: Medicaid Other | Admitting: Obstetrics & Gynecology

## 2021-02-02 MED ORDER — ACETAMINOPHEN 325 MG PO TABS: 650.0000 mg | ORAL_TABLET | Freq: Four times a day (QID) | ORAL | Status: DC | PRN

## 2021-02-02 MED ORDER — IBUPROFEN 600 MG PO TABS: 600.0000 mg | ORAL_TABLET | Freq: Four times a day (QID) | ORAL | Status: DC

## 2021-02-02 MED ORDER — COCONUT OIL OIL: 1.0000 "application " | TOPICAL_OIL | 0 refills | Status: DC | PRN

## 2021-02-02 NOTE — Lactation Note (Signed)
This note was copied from a baby's chart. Lactation Consultation Note  Patient Name: Betty Crawford Date: 02/02/2021 Reason for consult: Follow-up assessment;Term;Infant weight loss;Infant < 6lbs Age:26 hours ( dad interpreted - Arabic )  Per dad baby last fed at 1400 for 10 mins and had a wet.  Last stool at 12 noon.  LC reviewed breast feeding basics , importance of feeding with feeding cues and by 3 hour hours. ( 8-12 times in 24 hours)  Per mom breast are feeling fuller and heavier.  LC reviewed engorgement prevention and tx . Per mom no sore nipples.  See below for further teaching.  Mom has the [facility] brochure with resource numbers.   Maternal Data    Feeding Mother's Current Feeding Choice: Breast Milk  LATCH Score                    Lactation Tools Discussed/Used    Interventions Interventions: Breast feeding basics reviewed;Hand pump;Education  Discharge Discharge Education: Engorgement and breast care;Warning signs for feeding baby Pump: Manual  Consult Status Consult Status: Complete Date: 02/02/21    Myer Haff 02/02/2021, 2:31 PM

## 2021-02-02 NOTE — Discharge Instructions (Signed)
-take tylenol 1000 mg every 6 hours as needed for pain, alternate with ibuprofen 600 mg every 6 hours -drink plenty of water to help with breastfeeding -continue prenatal vitamins while you are breastfeeding   Postpartum Care After Vaginal Delivery The following information offers guidance about how to care for yourself from the time you deliver your baby to 6-12 weeks after delivery (postpartum period). If you have problems or questions, contact your health care provider for more specific instructions. Follow these instructions at home: Vaginal bleeding  It is normal to have vaginal bleeding (lochia) after delivery. Wear a sanitary pad for bleeding and discharge. ? During the first week after delivery, the amount and appearance of lochia is often similar to a menstrual period. ? Over the next few weeks, it will gradually decrease to a dry, yellow-brown discharge. ? For most women, lochia stops completely by 4-6 weeks after delivery, but can vary.  Change your sanitary pads frequently. Watch for any changes in your flow, such as: ? A sudden increase in volume. ? A change in color. ? Large blood clots.  If you pass a blood clot from your vagina, save it and call your health care provider. Do not flush blood clots down the toilet before talking with your health care provider.  Do not use tampons or douches until your health care provider approves.  If you are not breastfeeding, your period should return 6-8 weeks after delivery. If you are feeding your baby breast milk only, your period may not return until you stop breastfeeding. Perineal care  Keep the area between the vagina and the anus (perineum) clean and dry. Use medicated pads and pain-relieving sprays and creams as directed.  If you had a surgical cut in the perineum (episiotomy) or a tear, check the area for signs of infection until you are healed. Check for: ? More redness, swelling, or pain. ? Fluid or blood coming from the  cut or tear. ? Warmth. ? Pus or a bad smell.  You may be given a squirt bottle to use instead of wiping to clean the perineum area after you use the bathroom. Pat the area gently to dry it.  To relieve pain caused by an episiotomy, a tear, or swollen veins in the anus (hemorrhoids), take a warm sitz bath 2-3 times a day. In a sitz bath, the warm water should only come up to your hips and cover your buttocks.   Breast care  In the first few days after delivery, your breasts may feel heavy, full, and uncomfortable (breast engorgement). Milk may also leak from your breasts. Ask your health care provider about ways to help relieve the discomfort.  If you are breastfeeding: ? Wear a bra that supports your breasts and fits well. Use breast pads to absorb milk that leaks. ? Keep your nipples clean and dry. Apply creams and ointments as told. ? You may have uterine contractions every time you breastfeed for up to several weeks after delivery. This helps your uterus return to its normal size. ? If you have any problems with breastfeeding, notify your health care provider or lactation consultant.  If you are not breastfeeding: ? Avoid touching your breasts. Do not squeeze out (express) milk. Doing this can make your breasts produce more milk. ? Wear a good-fitting bra and use cold packs to help with swelling. Intimacy and sexuality  Ask your health care provider when you can engage in sexual activity. This may depend upon: ? Your risk of  infection. ? How fast you are healing. ? Your comfort and desire to engage in sexual activity.  You are able to get pregnant after delivery, even if you have not had your period. Talk with your health care provider about methods of birth control (contraception) or family planning if you desire future pregnancies. Medicines  Take over-the-counter and prescription medicines only as told by your health care provider.  Take an over-the-counter stool softener to  help ease bowel movements as told by your health care provider.  If you were prescribed an antibiotic medicine, take it as told by your health care provider. Do not stop taking the antibiotic even if you start to feel better.  Review all previous and current prescriptions to check for possible transfer into breast milk. Activity  Gradually return to your normal activities as told by your health care provider.  Rest as much as possible. Nap while your baby is sleeping. Eating and drinking  Drink enough fluid to keep your urine pale yellow.  To help prevent or relieve constipation, eat high-fiber foods every day.  Choose healthy eating to support breastfeeding or weight loss goals.  Take your prenatal vitamins until your health care provider tells you to stop.   General tips/recommendations  Do not use any products that contain nicotine or tobacco. These products include cigarettes, chewing tobacco, and vaping devices, such as e-cigarettes. If you need help quitting, ask your health care provider.  Do not drink alcohol, especially if you are breastfeeding.  Do not take medications or drugs that are not prescribed to you, especially if you are breastfeeding.  Visit your health care provider for a postpartum checkup within the first 3-6 weeks after delivery.  Complete a comprehensive postpartum visit no later than 12 weeks after delivery.  Keep all follow-up visits for you and your baby. Contact a health care provider if:  You feel unusually sad or worried.  Your breasts become red, painful, or hard.  You have a fever or other signs of an infection.  You have bleeding that is soaking through one pad an hour or you have blood clots.  You have a severe headache that doesn't go away or you have vision changes.  You have nausea and vomiting and are unable to eat or drink anything for 24 hours. Get help right away if:  You have chest pain or difficulty breathing.  You have  sudden, severe leg pain.  You faint or have a seizure.  You have thoughts about hurting yourself or your baby. If you ever feel like you may hurt yourself or others, or have thoughts about taking your own life, get help right away. Go to your nearest emergency department or:  Call your local emergency services (911 in the [place]  The National Suicide Prevention Lifeline at 732-176-1902. This suicide crisis helpline is open 24 hours a day.  Text the Crisis Text Line at 571-614-5023 (in the [place].). Summary  The period of time after you deliver your newborn up to 6-12 weeks after delivery is called the postpartum period.  Keep all follow-up visits for you and your baby.  Review all previous and current prescriptions to check for possible transfer into breast milk.  Contact a health care provider if you feel unusually sad or worried during the postpartum period. This information is not intended to replace advice given to you by your health care provider. Make sure you discuss any questions you have with your health care provider. Document Revised: 07/08/2020 Document  Reviewed: 07/08/2020 Elsevier Patient Education  2021 [place].   Intrauterine Device Insertion, Care After This sheet gives you information about how to care for yourself after your procedure. Your health care provider may also give you more specific instructions. If you have problems or questions, contact your health care provider. What can I expect after the procedure? After the procedure, it is common to have:  Cramps and pain in the abdomen.  Bleeding. It may be light or heavy. This may last for a few days.  Lower back pain.  Dizziness.  Headaches.  Nausea. Follow these instructions at home:  Before resuming sexual activity, check to make sure that you can feel the IUD string or strings. You should be able to feel the end of the string below the opening of your cervix. If your IUD string is in place, you may  resume sexual activity. ? If you had a hormonal IUD inserted more than 7 days after your most recent period started, you will need to use a backup method of birth control for 7 days after IUD insertion. Ask your health care provider whether this applies to you.  Continue to check that the IUD is still in place by feeling for the strings after every menstrual period, or once a month.  An IUD will not protect you from sexually transmitted infections (STIs). Use methods to prevent the exchange of body fluids between partners (barrier protection) every time you have sex. Barrier protection can be used during oral, vaginal, or anal sex. Commonly used barrier methods include: ? Female condom. ? Female condom. ? Dental dam.  Take over-the-counter and prescription medicines only as told by your health care provider.  Keep all follow-up visits as told by your health care provider. This is important.   Contact a health care provider if:  You feel light-headed or weak.  You have any of the following problems with your IUD string or strings: ? The string bothers or hurts you or your sexual partner. ? You cannot feel the string. ? The string has gotten longer.  You can feel the IUD in your vagina.  You think you may be pregnant, or you miss your menstrual period.  You think you may have a sexually transmitted infection (STI). Get help right away if:  You have flu-like symptoms, such as tiredness (fatigue) and muscle aches.  You have a fever and chills.  You have bleeding that is heavier or lasts longer than a normal menstrual cycle.  You have abnormal or bad-smelling discharge from your vagina.  You develop abdominal pain that is new, is getting worse, or is not in the same area of earlier cramping and pain.  You have pain during sexual activity. Summary  After the procedure, it is common to have cramps and pain in the abdomen. It is also common to have light bleeding or heavier bleeding  that is like your menstrual period.  Continue to check that the IUD is still in place by feeling for the strings after every menstrual period, or once a month.  Keep all follow-up visits as told by your health care provider. This is important.  Contact your health care provider if you have problems with your IUD strings, such as the string getting longer or bothering you or your sexual partner. This information is not intended to replace advice given to you by your health care provider. Make sure you discuss any questions you have with your health care provider. Document Revised: 10/14/2019  Document Reviewed: 10/14/2019 Elsevier Patient Education  [place].

## 2021-02-07 ENCOUNTER — Other Ambulatory Visit (HOSPITAL_COMMUNITY): Payer: Medicaid Other

## 2021-02-09 ENCOUNTER — Inpatient Hospital Stay (HOSPITAL_COMMUNITY): Payer: Medicaid Other

## 2021-02-09 ENCOUNTER — Inpatient Hospital Stay (HOSPITAL_COMMUNITY)
Admission: AD | Admit: 2021-02-09 | Payer: Medicaid Other | Source: Home / Self Care | Admitting: Obstetrics and Gynecology

## 2021-03-17 ENCOUNTER — Ambulatory Visit: Payer: Medicaid Other | Admitting: Obstetrics and Gynecology

## 2021-04-15 ENCOUNTER — Ambulatory Visit (INDEPENDENT_AMBULATORY_CARE_PROVIDER_SITE_OTHER): Payer: Medicaid Other | Admitting: Nurse Practitioner

## 2021-04-15 ENCOUNTER — Encounter: Payer: Self-pay | Admitting: Nurse Practitioner

## 2021-04-15 ENCOUNTER — Other Ambulatory Visit: Payer: Self-pay

## 2021-04-15 DIAGNOSIS — K5901 Slow transit constipation: Secondary | ICD-10-CM | POA: Diagnosis not present

## 2021-04-15 DIAGNOSIS — R21 Rash and other nonspecific skin eruption: Secondary | ICD-10-CM | POA: Diagnosis not present

## 2021-04-15 DIAGNOSIS — Z30430 Encounter for insertion of intrauterine contraceptive device: Secondary | ICD-10-CM

## 2021-04-15 DIAGNOSIS — T8332XA Displacement of intrauterine contraceptive device, initial encounter: Secondary | ICD-10-CM

## 2021-04-15 MED ORDER — TRIAMCINOLONE ACETONIDE 0.025 % EX OINT: 1.0000 "application " | TOPICAL_OINTMENT | Freq: Two times a day (BID) | CUTANEOUS | 1 refills | Status: DC

## 2021-04-15 MED ORDER — DOCUSATE CALCIUM 240 MG PO CAPS: 240.0000 mg | ORAL_CAPSULE | Freq: Every day | ORAL | 2 refills | Status: DC

## 2021-04-15 NOTE — Progress Notes (Signed)
[facility] Partum Visit Note  Brettney Ficken is a 25 y.o. G37P2002 female who presents for a postpartum visit. She is 10 weeks postpartum following a normal spontaneous vaginal delivery.  I have fully reviewed the prenatal and intrapartum course. The delivery was at 39.[redacted]wks gestational weeks.  Anesthesia: epidural. Postpartum course has been good. Baby is doing well. Baby is feeding by bottle - Similac Advance. Bleeding no bleeding. Bowel function is normal. Bladder function is normal. Patient is sexually active. Contraception method is IUD. Postpartum depression screening: negative.   The pregnancy intention screening data noted above was reviewed. Patient had IUD placed at delivery.    Edinburgh Postnatal Depression Scale - 04/15/21 0831       Edinburgh Postnatal Depression Scale:  In the Past 7 Days   I have been able to laugh and see the funny side of things. 0    I have looked forward with enjoyment to things. 0    I have blamed myself unnecessarily when things went wrong. 0    I have been anxious or worried for no good reason. 0    I have felt scared or panicky for no good reason. 0    Things have been getting on top of me. 0    I have been so unhappy that I have had difficulty sleeping. 0    I have felt sad or miserable. 0    I have been so unhappy that I have been crying. 0    The thought of harming myself has occurred to me. 0    Edinburgh Postnatal Depression Scale Total 0             Health Maintenance Due  Topic Date Due   COVID-19 Vaccine (1) Never done   HPV VACCINES (1 - 2-dose series) Never done    The following portions of the patient's history were reviewed and updated as appropriate: allergies, current medications, past family history, past medical history, past social history, past surgical history, and problem list.  Review of Systems Pertinent items noted in HPI and remainder of comprehensive ROS otherwise negative.  Objective:  BP 120/66   Pulse 64    Ht 5\' 3"  (1.6 m)   Wt 188 lb 3.2 oz (85.4 kg)   LMP 04/10/2021 (Approximate) Comment: heavy bleeding  Breastfeeding No   BMI 33.34 kg/m    General:  alert, cooperative, and no distress   Breasts:  not indicated  Lungs: clear to auscultation bilaterally  Heart:  regular rate and rhythm, S1, S2 normal, no murmur, click, rub or gallop  Abdomen: soft, non-tender; bowel sounds normal; no masses,  no organomegaly   Wound NA  GU exam:   Speculum exam done and no IUD strings could be identified      SKIN:  rash noted on neck and extending to upper back and on chest especially between the breasts as well.  Assessment:   Normal postpartum exam.  Constipation Rash No IUD strings seen  Plan:   Essential components of care per ACOG recommendations:  1.  Mood and well being: Patient with negative depression screening today. Reviewed local resources for support.  - Patient tobacco use? No.   - hx of drug use? No.    2. Infant care and feeding:  -Patient currently breastmilk feeding? No.  -Social determinants of health (SDOH) reviewed in EPIC. No concerns  3. Sexuality, contraception and birth spacing - Patient does not want a pregnancy in the next year.   -  Speculum exam today to check IUD strings - No strings found Will do [place] to see if IUD is in place Advised no sex or condoms if having intercourse to avoid pregnancy Urine pregnancy test today was negatvie  - Discussed birth spacing of 18 months  4. Sleep and fatigue -Encouraged family/partner/community support of 4 hrs of uninterrupted sleep to help with mood and fatigue  5. Physical Recovery  - Discussed patients delivery and complications. She describes her labor as good. - Patient had a Vaginal, no problems at delivery. Patient had  no  laceration. Perineal healing reviewed. Patient expressed understanding - Patient has urinary incontinence? No. - Patient is not safe to resume physical and sexual activity until [place] confirms if  IUD is in place Will make appointment for GYN visit after [place] is done  6.  Health Maintenance - HM due items addressed No - none needing to be addressed - Last pap smear  Diagnosis  Date Value Ref Range Status  07/21/2019   Final   NEGATIVE FOR INTRAEPITHELIAL LESIONS OR MALIGNANCY.   Pap smear not done at today's visit.  -Breast Cancer screening indicated? No.   7. Chronic Disease/Pregnancy Condition follow up:  for rash seen on neck and chest - cream prescribed but to follow up with PCP if rash does not resolve  - PCP follow up - list given  Virginia Rochester, NP [facility]

## 2021-04-15 NOTE — Patient Instructions (Addendum)
AREA FAMILY PRACTICE PHYSICIANS  [place] ([address]) [facility] [address] [place], [place] [address] [phone] Mon-Fri 8:30-12:30, 1:30-5:00 Accepting Medicaid [facility] [address], [place], [place] [address] [phone] Mon-Fri 8:00-5:30 [facility] [address] [place], [place] [address] [phone] Mon, Tue, Thur, Fri 8:30-5:00, Wed 10:00-7:00 (closed 1-2pm) Accepting Medicaid [facility] [address], [place], [place]  [address] Phone - [phone]   Fax - [phone]  [place] ([address]) [facility] [address] [place], [place] [address] [phone] Mon-Fri 8:00-5:00 [facility] ([facility])  [address] [place], [place] [address] [phone] Mon-Fri 8:30-5:30, Sat (Oct.-Mar.) 9:00-1:00 Accepting Medicaid  [place] ([address]) [facility] [address], [place], [place] [address] [phone] Mon-Fri 8:00-5:00  [place] ([address]) [facility] [address], [place], [place] [address] [phone] Mon-Fri 8:00-5:00 [facility] [address], [place], [place] [address] [phone] Mon-Fri 8:00-5:00 [facility] [address] [place], [place] [address] [phone] Mon-Fri 8:00-5:00 [facility] [address] [place] [address] [phone] Mon-Fri 7:30-5:30  [place] ([address] & [address]) [facility] [address] [place], [place] [address] [phone] Mon-Thur 8:00-6:00 Accepting Medicaid [facility] [address] [place], [place] [address] [phone] Mon-Thur 7:30-7:30, Fri 7:30-4:30 Accepting  Medicaid [facility] [address], [place], [place]  [address] [phone]   Fax - [phone]  [place]  ([address] & [address]) [facility] [address] , [place] [address] [phone] Mon-Fri 7:00-5:00 [facility] [address], [place], [place] [address] [phone] Mon-Fri 8:00-5:00 Accepting Medicaid [facility] [address], , [place] [address] [phone] Mon-Fri 8:00-5:00 Accepting Medicaid  [place] ([address]) [facility] [place] [address] [place], [place] [address] [phone] Mon-Fri 8:00-5:00 [facility] [address], [place], [place] [address] [phone] Mon-Fri 8:00-5:00 Accepting Medicaid [facility] ([facility]) [address], [place], [place] [address] [phone] Mon-Fri 8:00-5:30, Sat&Sun by appointment (phones open at 8:30) Accepting Medicaid  [place] ([address]) [facility] [address] [place], [place] [address] [phone] Mon-Thur 8:00-7:00, Fri 8:00-5:00, Sat 8:00-12:00, Sun 9:00-12:00 Accepting Medicaid [facility] [address], [place], [place] [address] [phone] Mon-Thur 8:00-5:00 Accepting Medicaid [facility] [address] [place], [place] [address] [phone] Mon-Fri 8:00-5:30, Sat (Oct.-Mar.) 9:00-1:00 Accepting Medicaid  [place] ([address]) [facility] [address], [place], [place] [address] [phone] Mon-Fri 8:00-5:00 Accepting Medicaid   [place] ([address]) [facility] [place] [address], [place], [place] [address] [phone] Mon-Fri 8:00-5:00  [facility] [place] [address], [place], [place] [address] [phone] Mon-Fri 8:00-5:00 [facility] [place] [address] [place] [address], [place], [place] [address] [phone] Mon-Fri 8:00-5:00 After hours clinic ([address] [place],  [address]) [phone] Mon-Fri 5:00-8:00, Sat 12:00-6:00, Sun 10:00-4:00 Accepting Medicaid [facility] [place]   [address], [place], [place]  [address] [phone]   Fax - [phone]  [place] ([address]) [facility] [place] [address], [place], [place] [address] [phone] Mon-Fri 8:00-5:00 [facility] [place] ([facility] [place]) [address], [place],  [address] [phone] Mon-Thur 8:00-7:00, Fri 8:00-5:00, Sat 8:00-12:00    

## 2021-04-19 ENCOUNTER — Ambulatory Visit (HOSPITAL_BASED_OUTPATIENT_CLINIC_OR_DEPARTMENT_OTHER): Admission: RE | Admit: 2021-04-19 | Payer: Medicaid Other | Source: Ambulatory Visit

## 2021-04-19 ENCOUNTER — Other Ambulatory Visit: Payer: Self-pay

## 2021-04-19 DIAGNOSIS — T8332XA Displacement of intrauterine contraceptive device, initial encounter: Secondary | ICD-10-CM

## 2021-04-29 ENCOUNTER — Ambulatory Visit (HOSPITAL_BASED_OUTPATIENT_CLINIC_OR_DEPARTMENT_OTHER)
Admission: RE | Admit: 2021-04-29 | Discharge: 2021-04-29 | Disposition: A | Discharge status: Home / Self Care | Payer: Medicaid Other | Source: Ambulatory Visit | Attending: Nurse Practitioner | Admitting: Nurse Practitioner

## 2021-04-29 ENCOUNTER — Other Ambulatory Visit: Payer: Self-pay

## 2021-04-29 DIAGNOSIS — T8332XA Displacement of intrauterine contraceptive device, initial encounter: Secondary | ICD-10-CM | POA: Diagnosis not present

## 2021-05-01 ENCOUNTER — Encounter: Payer: Self-pay | Admitting: Nurse Practitioner

## 2021-05-01 DIAGNOSIS — Z30431 Encounter for routine checking of intrauterine contraceptive device: Secondary | ICD-10-CM | POA: Insufficient documentation

## 2021-05-01 DIAGNOSIS — R935 Abnormal findings on diagnostic imaging of other abdominal regions, including retroperitoneum: Secondary | ICD-10-CM | POA: Insufficient documentation

## 2021-05-02 ENCOUNTER — Ambulatory Visit: Payer: Medicaid Other | Admitting: Nurse Practitioner

## 2021-05-13 ENCOUNTER — Ambulatory Visit (INDEPENDENT_AMBULATORY_CARE_PROVIDER_SITE_OTHER): Payer: Medicaid Other | Admitting: Nurse Practitioner

## 2021-05-13 ENCOUNTER — Other Ambulatory Visit: Payer: Self-pay

## 2021-05-13 ENCOUNTER — Encounter: Payer: Self-pay | Admitting: Nurse Practitioner

## 2021-05-13 DIAGNOSIS — Z30431 Encounter for routine checking of intrauterine contraceptive device: Secondary | ICD-10-CM | POA: Diagnosis not present

## 2021-05-13 DIAGNOSIS — Z975 Presence of (intrauterine) contraceptive device: Secondary | ICD-10-CM

## 2021-05-13 NOTE — Progress Notes (Signed)
Follow up for [place] results. No complaints today.

## 2021-05-13 NOTE — Progress Notes (Signed)
GYNECOLOGY OFFICE VISIT NOTE   History:  25 y.o. [id number] here today for ultrasound results about the presence of her IUD. Strings were not visible at her last appointment, but IUD verified by ultrasound in the correct position.  Endometrial lining was thickened.  Menses now 5 days in length with cramping (longer than menses without her IUD).  She denies any abnormal vaginal discharge, bleeding, pelvic pain or other concerns.    Past Medical History:  Diagnosis Date   Fibroid    Post term pregnancy at [redacted] weeks gestation 01/09/2020   Supervision of normal first pregnancy, antepartum 07/21/2019   .Marland Kitchen Nursing Staff Provider Office Location  [facility] Dating  LMP c/w 1st trim Language  Arabic Anatomy US   normal but limited due to fibroids, f/u scheduled Flu Vaccine  07-21-19 Genetic Screen  NIPS: Low risk female  AFP:   First Screen:  Quad:   TDaP vaccine   Hgb A1C or  GTT Early  Third trimester 2 hour wnl Rhogam  n/a   LAB RESULTS  Feeding Plan Breast/Bottle Blood Type O/Positive/-- (09/14 1419    Past Surgical History:  Procedure Laterality Date   NO PAST SURGERIES      The following portions of the patient's history were reviewed and updated as appropriate: allergies, current medications, past family history, past medical history, past social history, past surgical history and problem list.   Health Maintenance:  Normal pap 07-21-19.    Review of Systems:  Pertinent items noted in HPI and remainder of comprehensive ROS otherwise negative.  Objective:  Physical Exam BP 117/67   Pulse (!) 59   Ht 5\' 3"  (1.6 m)   Wt 183 lb 9.6 oz (83.3 kg)   BMI 32.52 kg/m  CONSTITUTIONAL: Well-developed, well-nourished female in no acute distress.  HENT:  Normocephalic, atraumatic. External right and left ear normal.  EYES: Conjunctivae and EOM are normal. Pupils are equal, round.  No scleral icterus.  NECK: Normal range of motion, supple,  SKIN: Skin is warm and dry. No rash noted. Not diaphoretic. No  erythema. No pallor. NEUROLOGIC: Alert and oriented to person, place, and time. Normal muscle tone coordination. No cranial nerve deficit noted. PSYCHIATRIC: Normal mood and affect. Normal behavior. Normal judgment and thought content. CARDIOVASCULAR: Normal heart rate noted RESPIRATORY: Effort and breath sounds normal, no problems with respiration noted ABDOMEN: Soft, no distention noted.   PELVIC: Deferred MUSCULOSKELETAL: Normal range of motion. No edema noted.  Labs and Imaging US PELVIC COMPLETE WITH TRANSVAGINAL  Result Date: 04/29/2021 CLINICAL DATA:  Malpositioned IUD; LMP 04/11/2021 EXAM: TRANSABDOMINAL AND TRANSVAGINAL ULTRASOUND OF PELVIS TECHNIQUE: Both transabdominal and transvaginal ultrasound examinations of the pelvis were performed. Transabdominal technique was performed for global imaging of the pelvis including uterus, ovaries, adnexal regions, and pelvic cul-de-sac. It was necessary to proceed with endovaginal exam following the transabdominal exam to visualize the IUD and ovaries. COMPARISON:  None FINDINGS: Uterus Measurements: 9.6 x 5.9 x 6.0 cm = volume: 177 mL. Anteverted. Mildly heterogeneous myometrium. Anterior wall intramural leiomyoma 18 x 19 x 21 mm. Endometrium Thickness: 21 mm. Mildly heterogeneous. No endometrial fluid. IUD in expected position at upper uterine segment endometrial canal. Right ovary Measurements: 4.1 x 2.7 x 2.3 cm = volume: 13.1 mL. Normal morphology without mass Left ovary Measurements: 3.4 x 2.4 x 2.4 cm = volume: 10.5 mL. Normal morphology without mass Other findings Trace free pelvic fluid.  No adnexal masses. IMPRESSION: 21 mm intramural leiomyoma anterior uterine wall. IUD in  expected position at upper uterine segment endometrial canal. Thickened endometrial complex 21 mm thick; endometrial thickness is considered abnormal. Consider follow-up by [place] in 6-8 weeks. Electronically Signed   By: Lavonia Dana M.D.   On: 04/29/2021 14:35    Assessment &  Plan:  IUD present - verified by [place]  Plan Reviewed [place] results - IUD in place Reviewed bleeding - 5 days now with cramping Taking Tylenol Reviewed this is normal for Paragard IUD - possibly will lessen after having IUD for 6 months. Take ibuprofen OTC by the package directions for pain if Tylenol is not helping. Reviewed BMI - has lost weight gained in pregnancy but advised continued weight loss for her best health.  Routine preventative health maintenance measures emphasized. Please refer to After Visit Summary for other counseling recommendations.   Return in about 1 year (around 05/13/2022) for annual exam.   Total face-to-face time with patient:  10  minutes.  Over 50% of encounter was spent on counseling and coordination of care.  Earlie Server, RN, MSN, NP-BC Nurse Practitioner, [facility] for [employer], [place] [facility] 05/13/2021 9:25 AM

## 2021-06-14 ENCOUNTER — Ambulatory Visit (INDEPENDENT_AMBULATORY_CARE_PROVIDER_SITE_OTHER): Payer: Medicaid Other | Admitting: Obstetrics and Gynecology

## 2021-06-14 ENCOUNTER — Other Ambulatory Visit: Payer: Self-pay

## 2021-06-14 VITALS — BP 121/79 | HR 60 | Ht 65.75 in | Wt 172.0 lb

## 2021-06-14 DIAGNOSIS — Z30431 Encounter for routine checking of intrauterine contraceptive device: Secondary | ICD-10-CM | POA: Diagnosis not present

## 2021-06-14 NOTE — Progress Notes (Signed)
    GYNECOLOGY OFFICE ENCOUNTER NOTE  History:  25 y.o. [id number] here today for today for IUD string check; Paragard  IUD was placed  post placental 01/31/2021. She feels IUD strings when she walks. [place] recently showed IUD was appropriately placed.   The following portions of the patient's history were reviewed and updated as appropriate: allergies, current medications, past family history, past medical history, past social history, past surgical history and problem list.  Review of Systems:  Pertinent items are noted in HPI.   Objective:  Physical Exam Blood pressure 121/79, pulse 60, height 5' 5.75" (1.67 m), weight 172 lb (78 kg), last menstrual period 06/06/2021, not currently breastfeeding. CONSTITUTIONAL: Well-developed, well-nourished female in no acute distress.  NEUROLOGIC: Alert and oriented to person, place, and time. Normal reflexes, muscle tone coordination.  PSYCHIATRIC: Normal mood and affect. Normal behavior. Normal judgment and thought content. CARDIOVASCULAR: Normal heart rate noted RESPIRATORY: Effort and breath sounds normal, no problems with respiration noted ABDOMEN: Soft, no distention noted.   PELVIC: Normal appearing external genitalia; normal appearing vaginal mucosa and cervix.  IUD strings visualized, about 3-4 cm in length outside cervix. Done in the presence of a chaperone.  Strings trimmed to 2 cm.   Assessment & Plan:  Patient to keep IUD in place for up to seven years; can come in for removal if she desires pregnancy earlier or for any concerning side effects. Call the office if strings continue to be bothersome.    Aliveah Gallant, Artist Pais, [place] for [employer], [place]

## 2021-06-14 NOTE — Progress Notes (Signed)
Reports being able to feel IUD strings when wiping and when walking feels discomfort.

## 2021-06-17 ENCOUNTER — Ambulatory Visit: Payer: Medicaid Other | Admitting: Obstetrics and Gynecology

## 2022-03-01 IMAGING — US US MFM OB DETAIL+14 WK
3 series · 13 of 28 positions shown · non-contrast
Comparison: none

[Series 1: us mfm ob detail+14 wk · 15 acquisitions, 3 frames shown (1 of 3)]
[im 3/15]
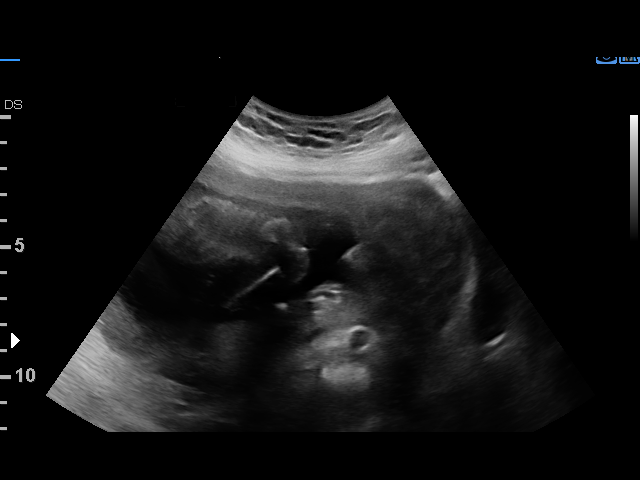
[im 9/15]
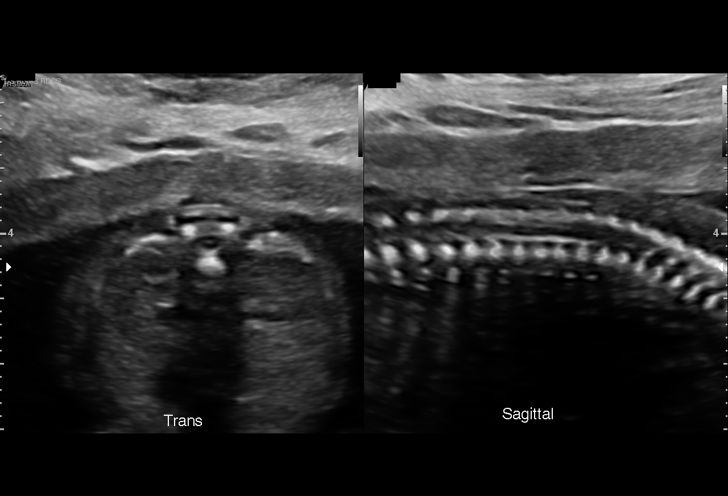
[im 15/15]
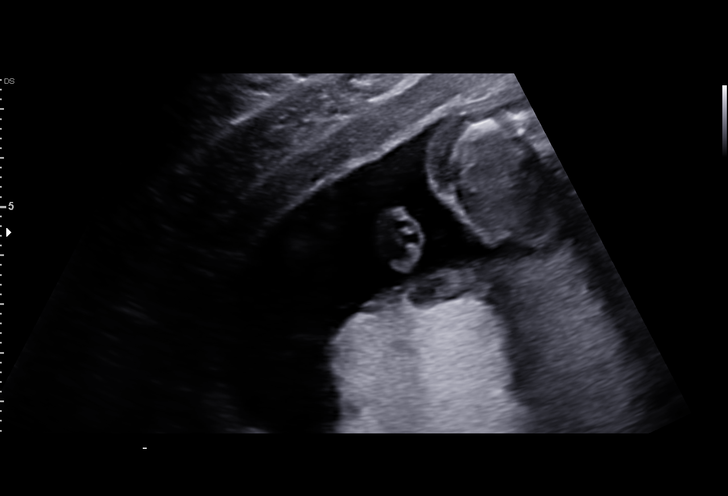

[Series 2: us mfm ob detail+14 wk · 4 of 27 slices shown (2 of 3)]
[im 4/27]
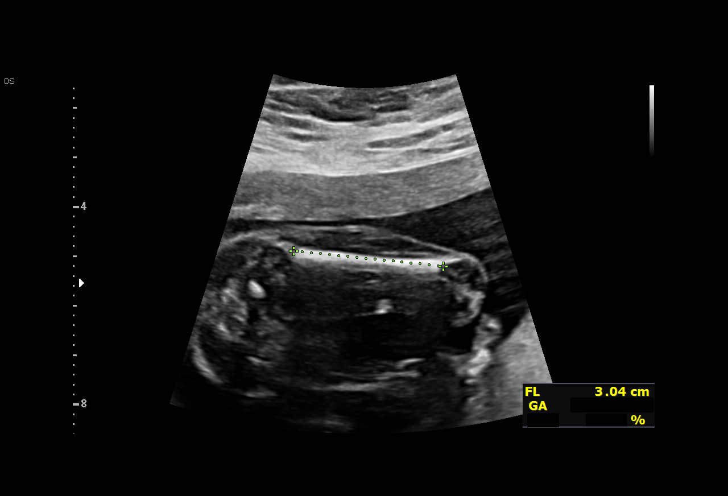
[im 10/27]
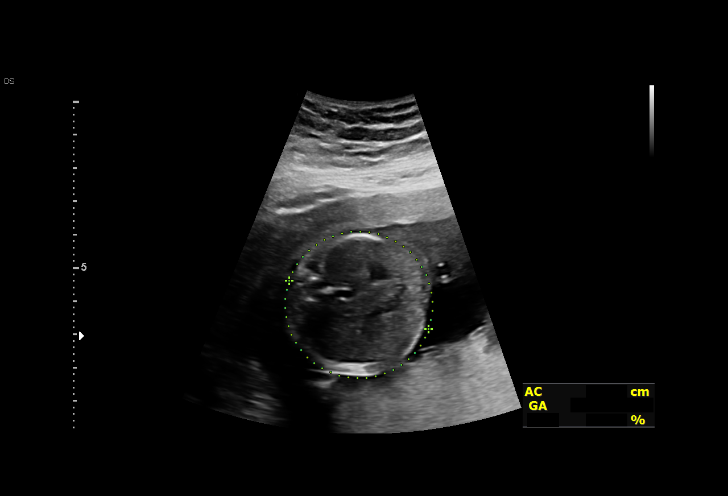
[im 17/27]
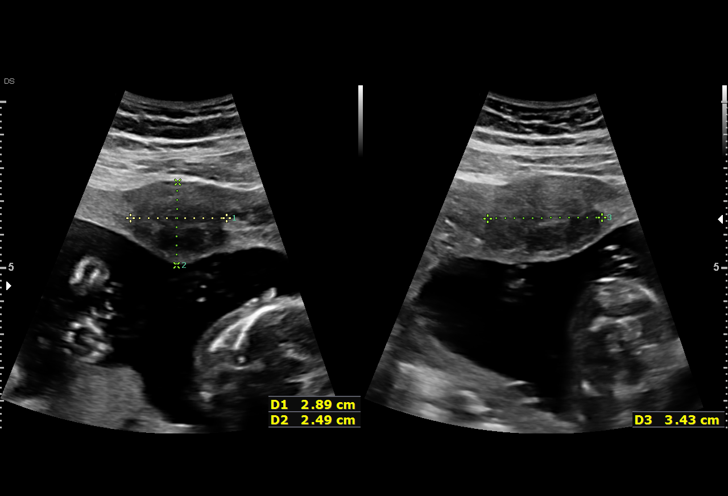
[im 27/27]
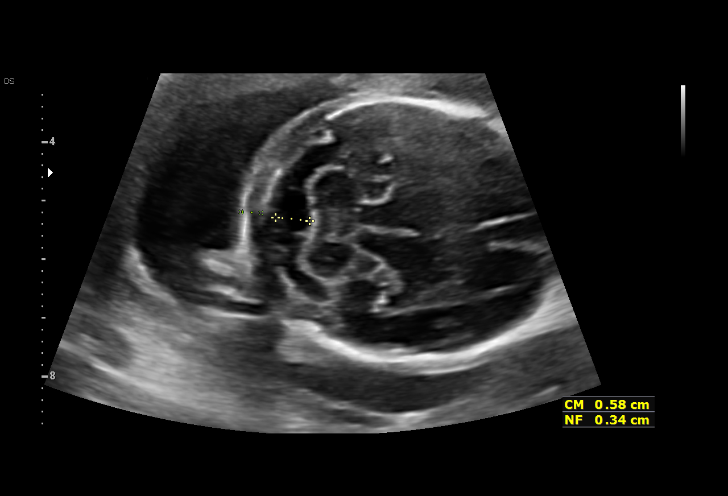

[Series 3: us mfm ob detail+14 wk · 6 of 38 slices shown (3 of 3)]
[im 4/38]
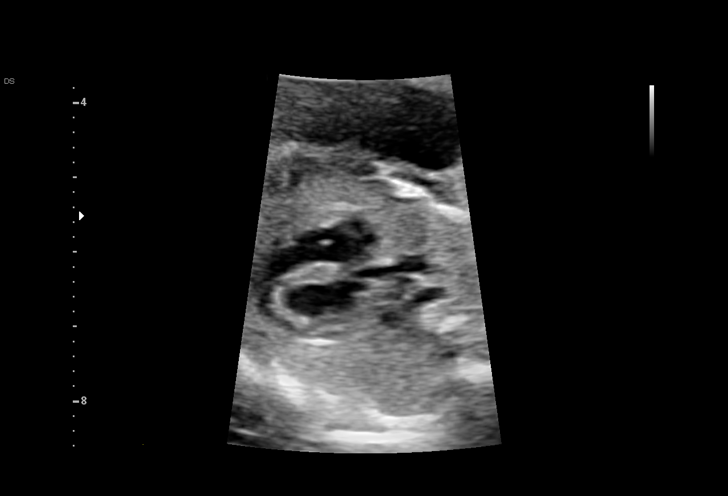
[im 10/38]
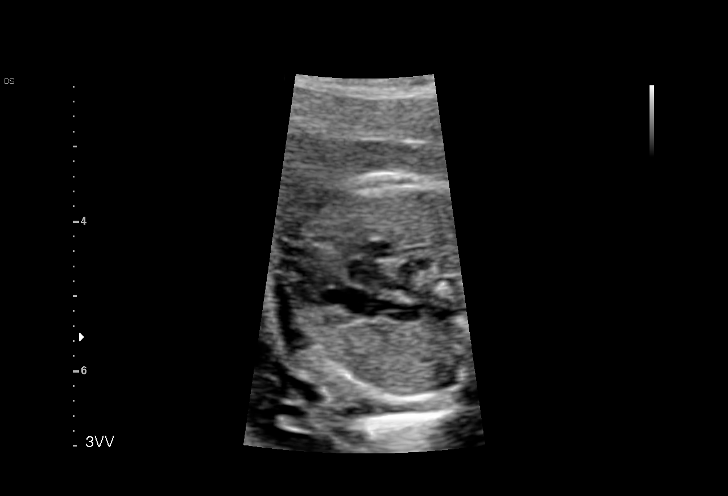
[im 16/38]
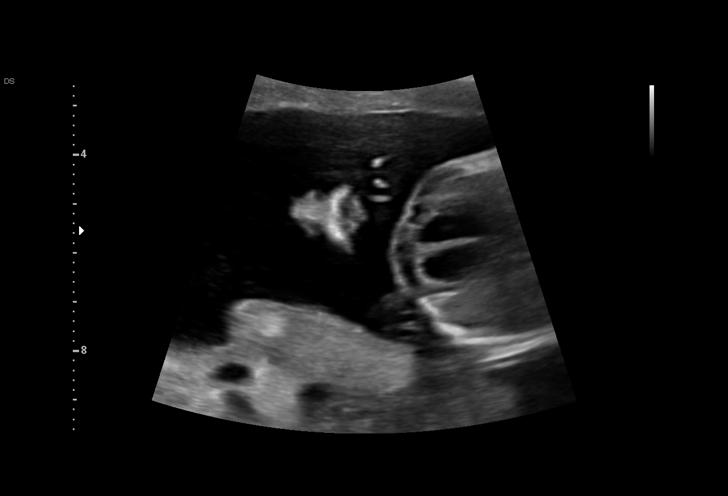
[im 22/38]
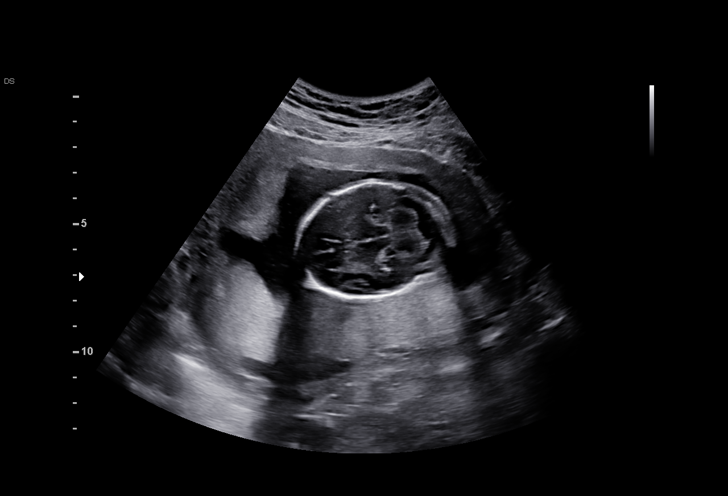
[im 28/38]
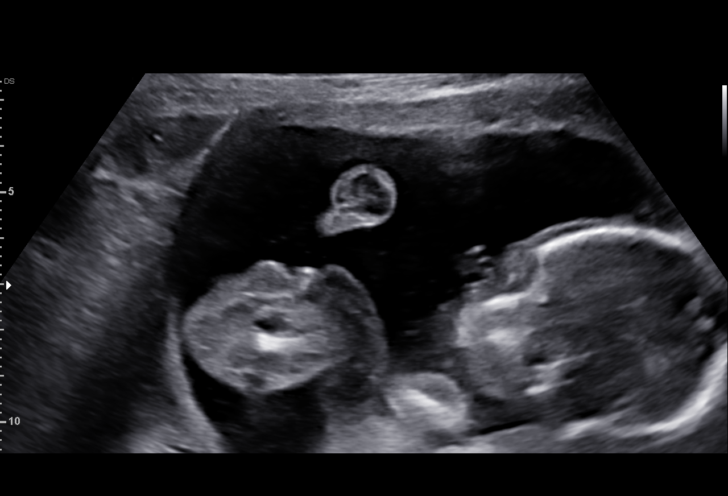
[im 34/38]
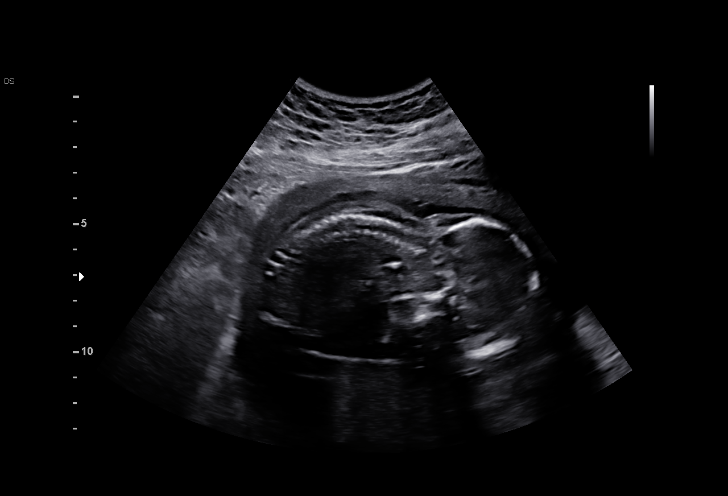

[13 of 28 positions shown; findings below may reference images not displayed]

Indications

 Late prenatal care, second trimester
 19 weeks gestation of pregnancy
 Encounter for other antenatal screening
 follow-up
 Uterine fibroids affecting pregnancy in        O34.12,
 second trimester, antepartum
Fetal Evaluation

 Num Of Fetuses:         1
 Cardiac Activity:       Observed
 Presentation:           Cephalic
 Placenta:               Posterior
 P. Cord Insertion:      Visualized, central

 Amniotic Fluid
 AFI FV:      Within normal limits
Biometry

 BPD:      43.3  mm     G. Age:  19w 1d         49  %    CI:        74.36   %    70 - 86
                                                         FL/HC:      18.9   %    16.1 -
 HC:      159.4  mm     G. Age:  18w 5d         26  %    HC/AC:      1.12        1.09 -
 AC:      142.9  mm     G. Age:  19w 4d         63  %    FL/BPD:     69.5   %
 FL:       30.1  mm     G. Age:  19w 2d         49  %    FL/AC:      21.1   %    20 - 24
 HUM:      29.1  mm     G. Age:  19w 3d         59  %
 CER:      20.1  mm     G. Age:  19w 3d         53  %
 NFT:       3.4  mm
 LV:        6.3  mm
 CM:        5.8  mm
 Est. FW:     290  gm    0 lb 10 oz      61  %
OB History

 Gravidity:    2         Term:   1
 Living:       1
Gestational Age

 LMP:           20w 3d        Date:  04/19/20                 EDD:   01/24/21
 U/S Today:     19w 1d                                        EDD:   02/02/21
 Best:          19w 1d     Det. By:  U/S (09/09/20)           EDD:   02/02/21
Anatomy

 Cranium:               Appears normal         Aortic Arch:            Appears normal
 Cavum:                 Appears normal         Ductal Arch:            Appears normal
 Ventricles:            Appears normal         Diaphragm:              Appears normal
 Choroid Plexus:        Appears normal         Stomach:                Appears normal, left
                                                                       sided
 Cerebellum:            Appears normal         Abdomen:                Appears normal
 Posterior Fossa:       Appears normal         Abdominal Wall:         Appears nml (cord
                                                                       insert, abd wall)
 Nuchal Fold:           Appears normal         Cord Vessels:           Appears normal (3
                                                                       vessel cord)
 Face:                  Appears normal         Kidneys:                Appear normal
                        (orbits and profile)
 Lips:                  Appears normal         Bladder:                Appears normal
 Thoracic:              Appears normal         Spine:                  Appears normal
 Heart:                 Appears normal         Upper Extremities:      Appears normal,
                        (4CH, axis, and                                Hands subopt.
                        situs)
 RVOT:                  Appears normal         Lower Extremities:      Appears normal
 LVOT:                  Appears normal

 Other:  Fetus appears to be a male. Nasal bone visualized. Heels/feet
         visualized. Hands not well visualized. VC, 3VV and 3VTV visualized.
         Technically difficult due to fetal position.
Cervix Uterus Adnexa

 Cervix
 Length:              4  cm.
 Normal appearance by transabdominal scan.
Myomas

 Site                     L(cm)      W(cm)      D(cm)       Location
 Left Anterior LUS        2.89       2.49       3.43        Intramural

 Blood Flow                  RI       PI       Comments

Comments

 This patient was seen for a detailed fetal anatomy scan due
 to a fibroid uterus and short interval pregnancy.  The patient
 presented late for prenatal care.
 She denies any significant past medical history and denies
 any problems in her current pregnancy.
 She had a cell free DNA test earlier in her pregnancy which
 indicated a low risk for trisomy 21, 18, and 13. A male fetus is
 predicted.
 Based on the fetal biometry measurements obtained today,
 her EDC was changed to February 02, 2021.
 There were no obvious fetal anomalies noted on today's
 ultrasound exam.
 The patient was informed that anomalies may be missed due
 to technical limitations. If the fetus is in a suboptimal position
 or maternal habitus is increased, visualization of the fetus in
 the maternal uterus may be impaired.
 A 2 to 3 cm left anterior fibroid was noted on today's exam.
 The increased risk of fetal growth issues and maternal pain
 issues associated with fibroids in pregnancy was discussed.
 A follow-up exam was scheduled in 4 weeks to confirm her
 dates.
 All conversations were held with the patient today with the
 help of an Arabic interpreter.

## 2022-09-26 ENCOUNTER — Ambulatory Visit (INDEPENDENT_AMBULATORY_CARE_PROVIDER_SITE_OTHER): Payer: Medicaid Other

## 2022-09-26 ENCOUNTER — Other Ambulatory Visit (HOSPITAL_COMMUNITY)
Admission: RE | Admit: 2022-09-26 | Discharge: 2022-09-26 | Disposition: A | Discharge status: Home / Self Care | Payer: Medicaid Other | Source: Ambulatory Visit | Attending: Obstetrics and Gynecology | Admitting: Obstetrics and Gynecology

## 2022-09-26 DIAGNOSIS — N898 Other specified noninflammatory disorders of vagina: Secondary | ICD-10-CM | POA: Insufficient documentation

## 2022-09-26 DIAGNOSIS — Z23 Encounter for immunization: Secondary | ICD-10-CM | POA: Diagnosis not present

## 2022-09-26 MED ORDER — FLUCONAZOLE 150 MG PO TABS: 150.0000 mg | ORAL_TABLET | Freq: Once | ORAL | 0 refills | Status: AC

## 2022-09-26 NOTE — Progress Notes (Signed)
SUBJECTIVE:  26 y.o. female complains of white and thin vaginal discharge with vaginal itching for 10 day(s). Denies abnormal vaginal bleeding or significant pelvic pain or fever. No UTI symptoms. Denies history of known exposure to STD.  No LMP recorded.  OBJECTIVE:  She appears well, afebrile. Urine dipstick: not done.  ASSESSMENT:  Vaginal Discharge  Vaginal itching   PLAN:  GC, chlamydia, trichomonas, BVAG, CVAG probe sent to lab. Treatment: To be determined once lab results are received ROV prn if symptoms persist or worsen.

## 2022-09-27 LAB — CERVICOVAGINAL ANCILLARY ONLY
Bacterial Vaginitis (gardnerella): POSITIVE — AB
Candida Glabrata: NEGATIVE
Candida Vaginitis: POSITIVE — AB
Chlamydia: NEGATIVE
Comment: NEGATIVE
Comment: NEGATIVE
Comment: NEGATIVE
Comment: NEGATIVE
Comment: NEGATIVE
Comment: NORMAL
Neisseria Gonorrhea: NEGATIVE
Trichomonas: NEGATIVE

## 2022-10-03 ENCOUNTER — Other Ambulatory Visit: Payer: Self-pay | Admitting: Emergency Medicine

## 2022-10-03 DIAGNOSIS — B9689 Other specified bacterial agents as the cause of diseases classified elsewhere: Secondary | ICD-10-CM

## 2022-10-03 MED ORDER — METRONIDAZOLE 500 MG PO TABS: 500.0000 mg | ORAL_TABLET | Freq: Two times a day (BID) | ORAL | 0 refills | Status: DC

## 2022-10-03 MED ORDER — FLUCONAZOLE 150 MG PO TABS: 150.0000 mg | ORAL_TABLET | Freq: Once | ORAL | 0 refills | Status: AC

## 2022-10-03 NOTE — Progress Notes (Signed)
Rx for yeast & BV

## 2022-10-19 IMAGING — US US PELVIS COMPLETE WITH TRANSVAGINAL
2 series · 13 of 25 positions shown · non-contrast
Comparison: None

CLINICAL DATA: Malpositioned IUD; LMP 04/11/2021

EXAM:
TRANSABDOMINAL AND TRANSVAGINAL ULTRASOUND OF PELVIS
TECHNIQUE: Both transabdominal and transvaginal ultrasound examinations of the
pelvis were performed. Transabdominal technique was performed for
global imaging of the pelvis including uterus, ovaries, adnexal
regions, and pelvic cul-de-sac. It was necessary to proceed with
endovaginal exam following the transabdominal exam to visualize the
IUD and ovaries.

[Series 1: us pelvic complete with transvaginal · 11 of 65 slices shown]
[im 1/65]
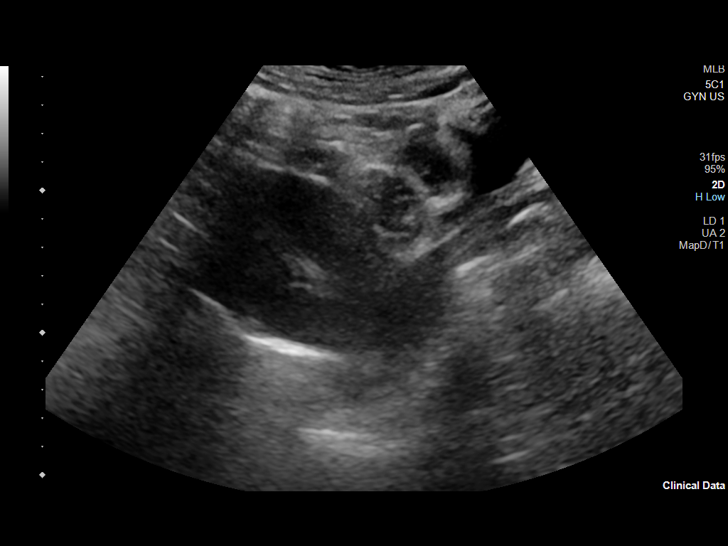
[im 7/65]
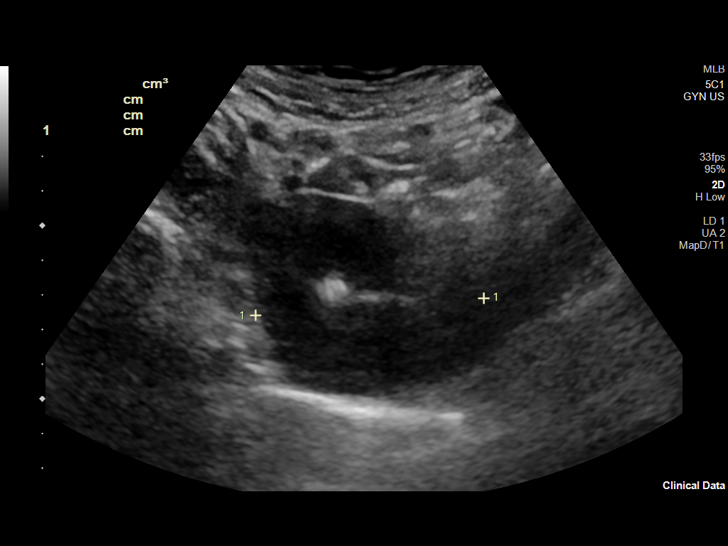
[im 13/65]
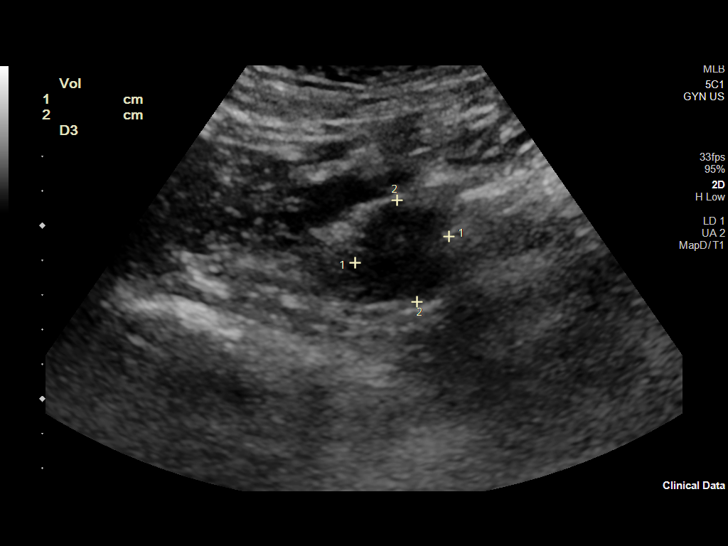
[im 19/65]
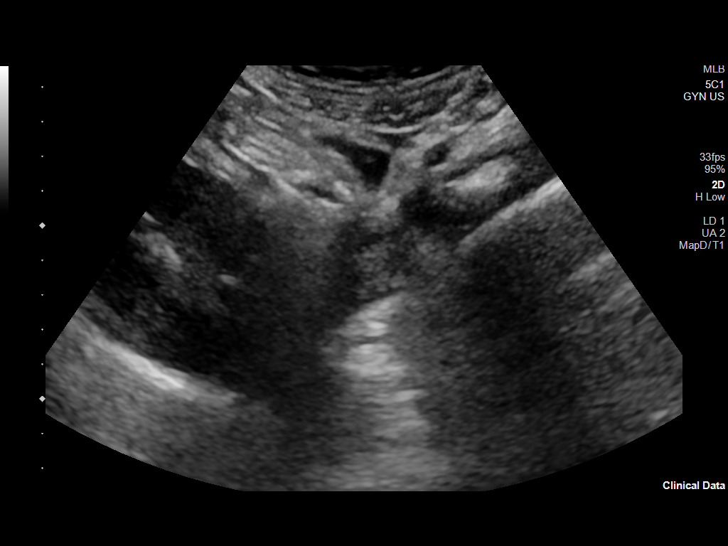
[im 25/65]
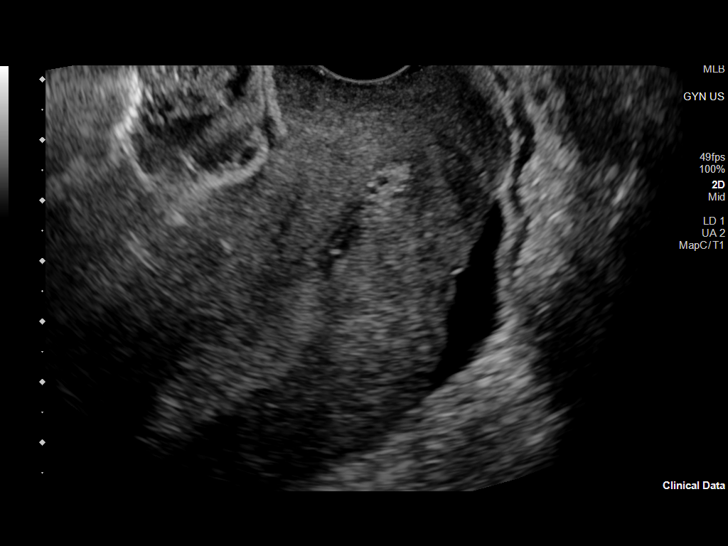
[im 31/65]
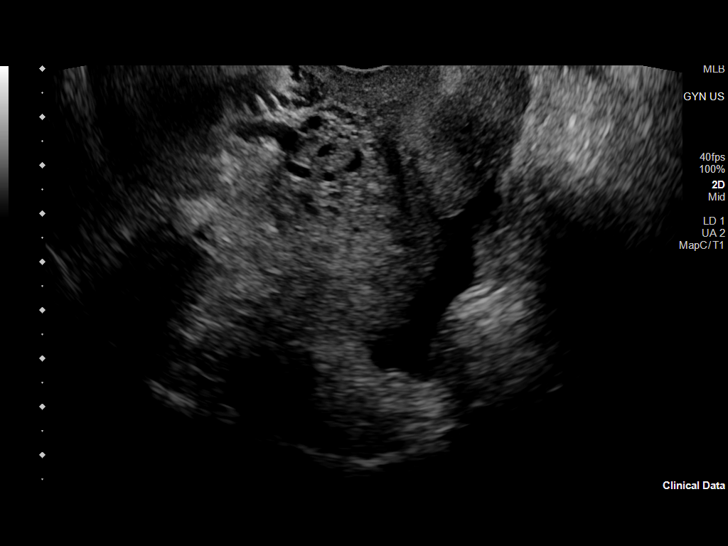
[im 37/65]
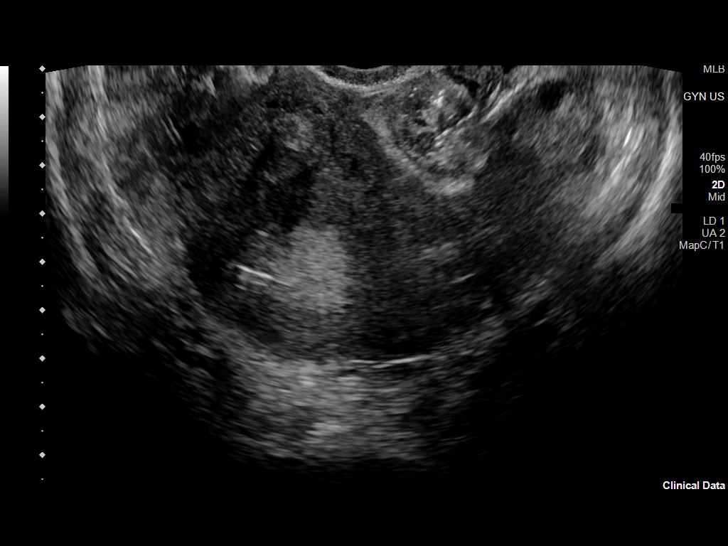
[im 43/65]
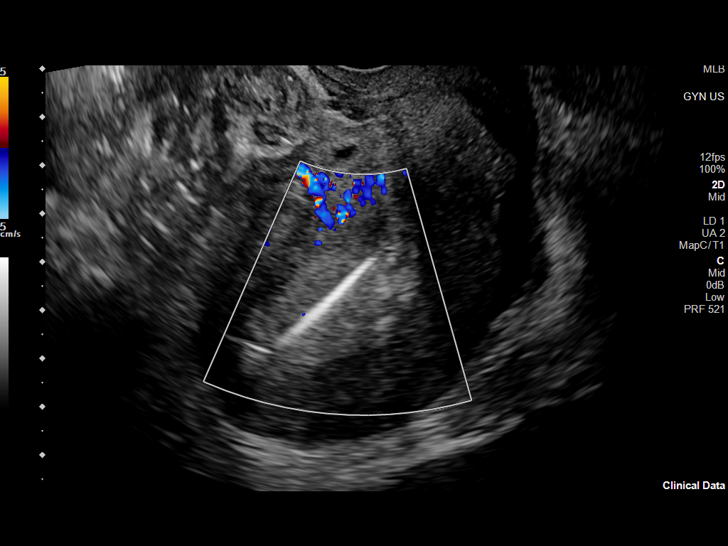
[im 49/65]
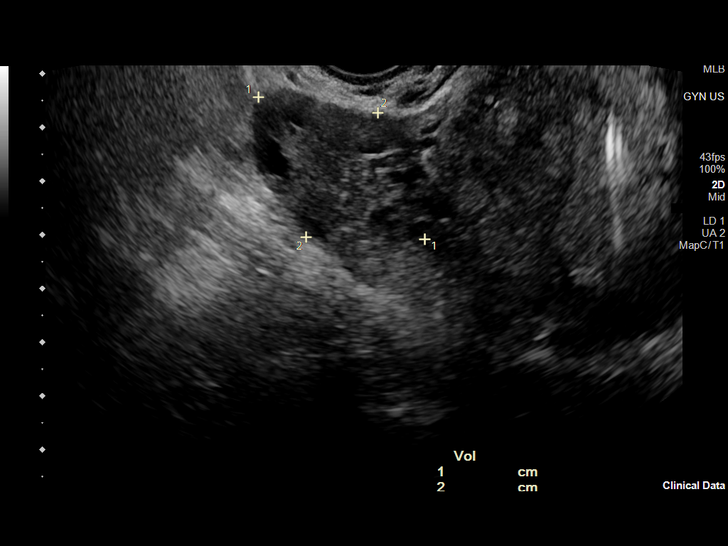
[im 55/65]
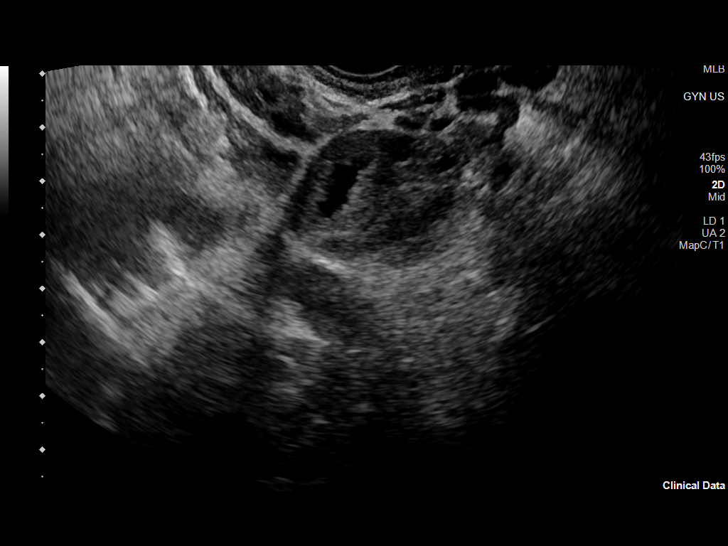
[im 61/65]
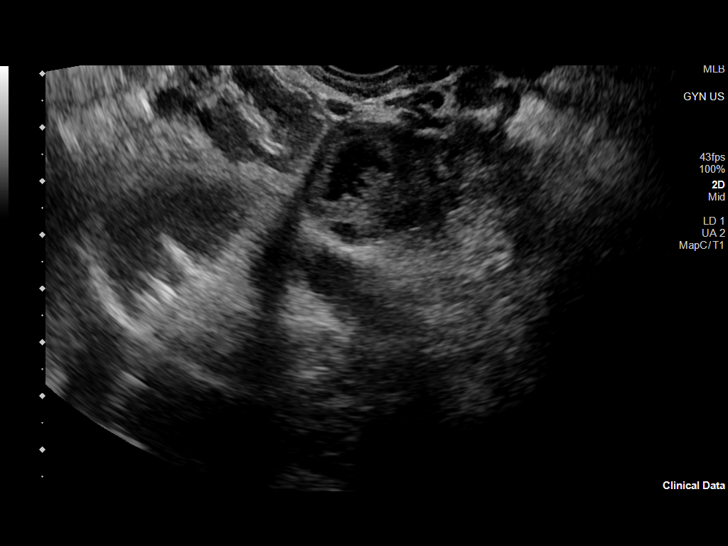

[Series 1001: gyn us · 2 of 8 slices shown]
[im 1/8]
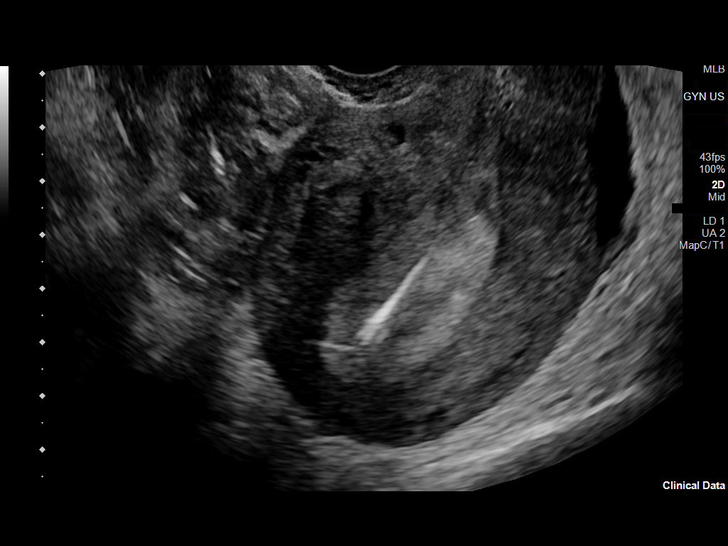
[im 8/8]
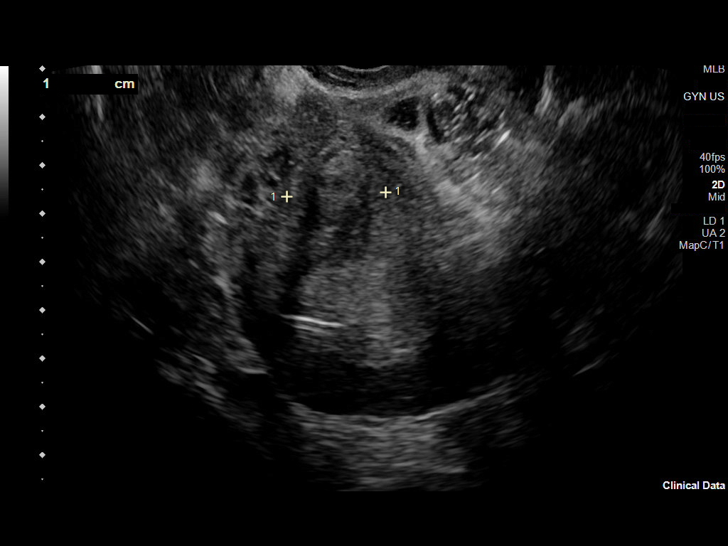

[13 of 25 positions shown; findings below may reference images not displayed]

FINDINGS: Uterus

Measurements: 9.6 x 5.9 x 6.0 cm = volume: 177 mL. Anteverted.
Mildly heterogeneous myometrium. Anterior wall intramural leiomyoma
18 x 19 x 21 mm.

Endometrium

Thickness: 21 mm. Mildly heterogeneous. No endometrial fluid. IUD in
expected position at upper uterine segment endometrial canal.

Right ovary

Measurements: 4.1 x 2.7 x 2.3 cm = volume: 13.1 mL. Normal
morphology without mass

Left ovary

Measurements: 3.4 x 2.4 x 2.4 cm = volume: 10.5 mL. Normal
morphology without mass

Other findings

Trace free pelvic fluid.  No adnexal masses.
IMPRESSION: 21 mm intramural leiomyoma anterior uterine wall.

IUD in expected position at upper uterine segment endometrial canal.

Thickened endometrial complex 21 mm thick; endometrial thickness is
considered abnormal. Consider follow-up by US in 6-8 weeks.

## 2023-07-23 ENCOUNTER — Encounter (HOSPITAL_BASED_OUTPATIENT_CLINIC_OR_DEPARTMENT_OTHER): Payer: Self-pay

## 2023-07-23 ENCOUNTER — Other Ambulatory Visit: Payer: Self-pay

## 2023-07-23 ENCOUNTER — Emergency Department (HOSPITAL_BASED_OUTPATIENT_CLINIC_OR_DEPARTMENT_OTHER)
Admission: EM | Admit: 2023-07-23 | Discharge: 2023-07-24 | Disposition: A | Discharge status: Home / Self Care | Payer: Medicaid Other | Attending: Emergency Medicine | Admitting: Emergency Medicine

## 2023-07-23 DIAGNOSIS — M545 Low back pain, unspecified: Secondary | ICD-10-CM | POA: Diagnosis present

## 2023-07-23 DIAGNOSIS — M25552 Pain in left hip: Secondary | ICD-10-CM | POA: Insufficient documentation

## 2023-07-23 NOTE — ED Triage Notes (Signed)
Pt c/o back pain down left buttocks Sciatica symptoms x 15 days Denies any injury No meds PTA

## 2023-07-24 MED ORDER — IBUPROFEN 600 MG PO TABS: 600.0000 mg | ORAL_TABLET | Freq: Three times a day (TID) | ORAL | Status: AC | PRN

## 2023-07-24 MED ORDER — CYCLOBENZAPRINE HCL 10 MG PO TABS: 10.0000 mg | ORAL_TABLET | Freq: Two times a day (BID) | ORAL | 0 refills | Status: AC | PRN

## 2023-07-24 MED ORDER — IBUPROFEN 400 MG PO TABS
600.0000 mg | ORAL_TABLET | Freq: Once | ORAL | Status: AC
  Administered 2023-07-24: 600 mg via ORAL
  Filled 2023-07-24: qty 1

## 2023-07-24 NOTE — ED Provider Notes (Signed)
   [facility]  Provider Note  CSN: [id number] Arrival date & time: 07/23/23 2114  History Chief Complaint  Patient presents with   Back Pain    Betty Crawford is a 27 y.o. female reports about 2 weeks of left lower back pain. Worse with movement. Does not radiate down her leg. Some pain in L hip/buttock. No dysuria, hematuria, fever, N/V/D. No falls or injuries. No red flags.    Home Medications Prior to Admission medications   Medication Sig Start Date End Date Taking? Authorizing Provider  cyclobenzaprine (FLEXERIL) 10 MG tablet Take 1 tablet (10 mg total) by mouth 2 (two) times daily as needed for muscle spasms. 07/24/23  Yes Pollyann Savoy, MD  ibuprofen (ADVIL) 600 MG tablet Take 1 tablet (600 mg total) by mouth every 8 (eight) hours as needed for moderate pain. 07/24/23   Pollyann Savoy, MD     Allergies    Patient has no known allergies.   Review of Systems   Review of Systems Please see HPI for pertinent positives and negatives  Physical Exam BP 138/77   Pulse 77   Temp 98.2 F (36.8 C)   Resp 18   Ht 5\' 4"  (1.626 m)   Wt 57.6 kg   LMP 07/08/2023 (Exact Date)   SpO2 100%   BMI 21.80 kg/m   Physical Exam Vitals and nursing note reviewed.  HENT:     Head: Normocephalic.     Nose: Nose normal.  Eyes:     Extraocular Movements: Extraocular movements intact.  Pulmonary:     Effort: Pulmonary effort is normal.  Musculoskeletal:        General: Tenderness (L lumbar paraspinal muscles) present. Normal range of motion.     Cervical back: Neck supple.  Skin:    Findings: No rash (on exposed skin).  Neurological:     Mental Status: She is alert and oriented to person, place, and time.     Sensory: No sensory deficit.     Motor: No weakness.     Gait: Gait normal.  Psychiatric:        Mood and Affect: Mood normal.     ED Results / Procedures / Treatments    EKG None  Procedures Procedures  Medications Ordered in the ED Medications  ibuprofen (ADVIL) tablet 600 mg (has no administration in time range)    Initial Impression and Plan  Patient here with uncomplicated low back pain. Has not tried taking anything at home. No red flags for acute surgical process. Will give Motrin/Flexeril and recommend PCP follow up.   ED Course       MDM Rules/Calculators/A&P Medical Decision Making Problems Addressed: Acute left-sided low back pain without sciatica: acute illness or injury  Risk Prescription drug management.     Final Clinical Impression(s) / ED Diagnoses Final diagnoses:  Acute left-sided low back pain without sciatica    Rx / DC Orders ED Discharge Orders          Ordered    ibuprofen (ADVIL) 600 MG tablet  Every 8 hours PRN        07/24/23 0042    cyclobenzaprine (FLEXERIL) 10 MG tablet  2 times daily PRN        07/24/23 0042             Pollyann Savoy, MD 07/24/23 [phone]
